# Patient Record
Sex: Female | Born: 1950
Health system: Southern US, Community
[De-identification: ages and names within clinical notes are randomized; demographics above are authoritative.]

## PROBLEM LIST (undated history)

## (undated) DIAGNOSIS — M1990 Unspecified osteoarthritis, unspecified site: Secondary | ICD-10-CM

## (undated) DIAGNOSIS — G4700 Insomnia, unspecified: Secondary | ICD-10-CM

## (undated) DIAGNOSIS — K219 Gastro-esophageal reflux disease without esophagitis: Secondary | ICD-10-CM

## (undated) DIAGNOSIS — R51 Headache: Secondary | ICD-10-CM

## (undated) HISTORY — DX: Unspecified osteoarthritis, unspecified site: M19.90

## (undated) HISTORY — PX: TONSILLECTOMY: SUR1361

## (undated) HISTORY — DX: Gastro-esophageal reflux disease without esophagitis: K21.9

## (undated) HISTORY — DX: Insomnia, unspecified: G47.00

## (undated) HISTORY — DX: Headache: R51

## (undated) HISTORY — PX: KNEE ARTHROSCOPY: SHX127

---

## 1998-04-16 ENCOUNTER — Emergency Department (HOSPITAL_COMMUNITY): Admission: EM | Admit: 1998-04-16 | Discharge: 1998-04-16 | Payer: Self-pay | Admitting: Emergency Medicine

## 1998-04-16 ENCOUNTER — Encounter: Payer: Self-pay | Admitting: Emergency Medicine

## 1998-11-19 ENCOUNTER — Other Ambulatory Visit: Admission: RE | Admit: 1998-11-19 | Discharge: 1998-11-19 | Payer: Self-pay | Admitting: Gynecology

## 1999-11-20 ENCOUNTER — Other Ambulatory Visit: Admission: RE | Admit: 1999-11-20 | Discharge: 1999-11-20 | Payer: Self-pay | Admitting: Gynecology

## 2000-10-26 ENCOUNTER — Other Ambulatory Visit: Admission: RE | Admit: 2000-10-26 | Discharge: 2000-10-26 | Payer: Self-pay | Admitting: Gynecology

## 2001-10-21 ENCOUNTER — Other Ambulatory Visit: Admission: RE | Admit: 2001-10-21 | Discharge: 2001-10-21 | Payer: Self-pay | Admitting: Gynecology

## 2002-11-01 ENCOUNTER — Other Ambulatory Visit: Admission: RE | Admit: 2002-11-01 | Discharge: 2002-11-01 | Payer: Self-pay | Admitting: Gynecology

## 2003-11-01 ENCOUNTER — Other Ambulatory Visit: Admission: RE | Admit: 2003-11-01 | Discharge: 2003-11-01 | Payer: Self-pay | Admitting: Gynecology

## 2004-10-23 ENCOUNTER — Encounter: Admission: RE | Admit: 2004-10-23 | Discharge: 2004-10-23 | Payer: Self-pay | Admitting: *Deleted

## 2004-11-25 ENCOUNTER — Other Ambulatory Visit: Admission: RE | Admit: 2004-11-25 | Discharge: 2004-11-25 | Payer: Self-pay | Admitting: Gynecology

## 2005-12-08 ENCOUNTER — Other Ambulatory Visit: Admission: RE | Admit: 2005-12-08 | Discharge: 2005-12-08 | Payer: Self-pay | Admitting: Gynecology

## 2005-12-24 ENCOUNTER — Ambulatory Visit: Payer: Self-pay | Admitting: Family Medicine

## 2006-02-04 ENCOUNTER — Ambulatory Visit: Payer: Self-pay | Admitting: Family Medicine

## 2006-02-25 ENCOUNTER — Ambulatory Visit: Payer: Self-pay | Admitting: Family Medicine

## 2006-10-14 ENCOUNTER — Emergency Department (HOSPITAL_COMMUNITY): Admission: EM | Admit: 2006-10-14 | Discharge: 2006-10-14 | Payer: Self-pay | Admitting: Emergency Medicine

## 2006-10-19 ENCOUNTER — Ambulatory Visit: Payer: Self-pay | Admitting: Family Medicine

## 2006-10-26 ENCOUNTER — Ambulatory Visit: Payer: Self-pay

## 2006-11-30 ENCOUNTER — Encounter: Payer: Self-pay | Admitting: Family Medicine

## 2006-12-09 ENCOUNTER — Other Ambulatory Visit: Admission: RE | Admit: 2006-12-09 | Discharge: 2006-12-09 | Payer: Self-pay | Admitting: Gynecology

## 2007-03-11 ENCOUNTER — Telehealth: Payer: Self-pay | Admitting: Family Medicine

## 2007-04-27 ENCOUNTER — Encounter: Payer: Self-pay | Admitting: Family Medicine

## 2007-09-21 ENCOUNTER — Telehealth: Payer: Self-pay | Admitting: Family Medicine

## 2007-09-23 ENCOUNTER — Telehealth: Payer: Self-pay | Admitting: Family Medicine

## 2007-10-18 ENCOUNTER — Ambulatory Visit: Payer: Self-pay | Admitting: Family Medicine

## 2007-10-18 DIAGNOSIS — R519 Headache, unspecified: Secondary | ICD-10-CM | POA: Insufficient documentation

## 2007-10-18 DIAGNOSIS — M1990 Unspecified osteoarthritis, unspecified site: Secondary | ICD-10-CM | POA: Insufficient documentation

## 2007-10-18 DIAGNOSIS — G4700 Insomnia, unspecified: Secondary | ICD-10-CM

## 2007-10-18 DIAGNOSIS — K219 Gastro-esophageal reflux disease without esophagitis: Secondary | ICD-10-CM

## 2007-10-18 DIAGNOSIS — R51 Headache: Secondary | ICD-10-CM

## 2007-10-18 DIAGNOSIS — J45909 Unspecified asthma, uncomplicated: Secondary | ICD-10-CM | POA: Insufficient documentation

## 2007-12-09 ENCOUNTER — Encounter: Payer: Self-pay | Admitting: Family Medicine

## 2007-12-09 ENCOUNTER — Other Ambulatory Visit: Admission: RE | Admit: 2007-12-09 | Discharge: 2007-12-09 | Payer: Self-pay | Admitting: Gynecology

## 2008-12-11 ENCOUNTER — Encounter: Payer: Self-pay | Admitting: Family Medicine

## 2008-12-25 ENCOUNTER — Telehealth: Payer: Self-pay | Admitting: Family Medicine

## 2008-12-28 ENCOUNTER — Ambulatory Visit: Payer: Self-pay | Admitting: Family Medicine

## 2008-12-31 IMAGING — CR DG CHEST 1V PORT
1 series · 1 of 1 positions shown · non-contrast
Comparison: Report of a prior chest x-ray of 04/16/98.

CLINICAL DATA: Chest pain/short of breath.
 PORTABLE CHEST ? 1 VIEW:

[view not recorded]
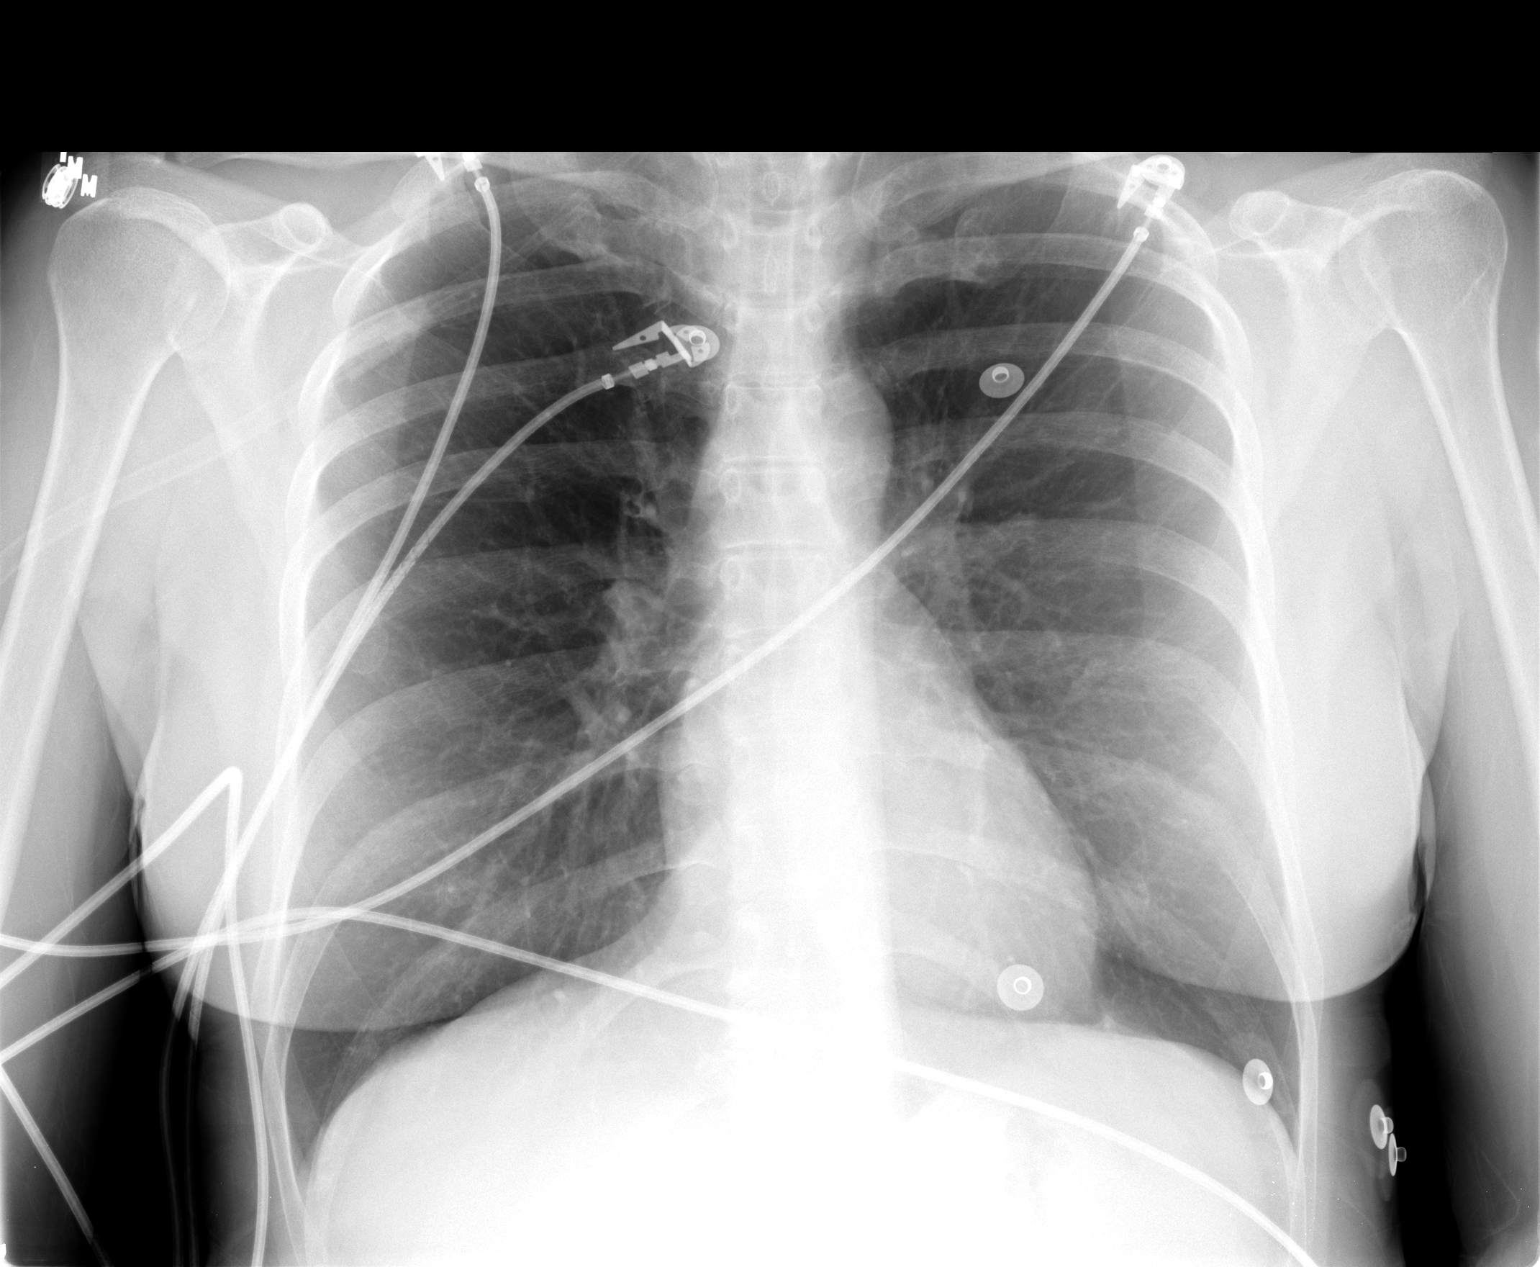

[1 of 1 positions shown; findings below may reference images not displayed]

FINDINGS: Heart and vascularity are normal.  The lungs are clear.  Osseous structures are intact in one view.
IMPRESSION: No active disease.

## 2009-01-15 ENCOUNTER — Ambulatory Visit: Payer: Self-pay | Admitting: Family Medicine

## 2009-01-15 DIAGNOSIS — R634 Abnormal weight loss: Secondary | ICD-10-CM | POA: Insufficient documentation

## 2009-01-15 LAB — CONVERTED CEMR LAB
Bilirubin Urine: NEGATIVE
Blood in Urine, dipstick: NEGATIVE
Glucose, Urine, Semiquant: NEGATIVE
Nitrite: NEGATIVE
Protein, U semiquant: NEGATIVE
Specific Gravity, Urine: 1.02
Urobilinogen, UA: 0.2
WBC Urine, dipstick: NEGATIVE
pH: 7.5

## 2009-01-18 LAB — CONVERTED CEMR LAB
ALT: 43 units/L — ABNORMAL HIGH (ref 0–35)
AST: 26 units/L (ref 0–37)
Albumin: 4 g/dL (ref 3.5–5.2)
Alkaline Phosphatase: 64 units/L (ref 39–117)
BUN: 14 mg/dL (ref 6–23)
Basophils Absolute: 0.1 10*3/uL (ref 0.0–0.1)
Basophils Relative: 0.6 % (ref 0.0–3.0)
Bilirubin, Direct: 0 mg/dL (ref 0.0–0.3)
CO2: 32 meq/L (ref 19–32)
Calcium: 9.3 mg/dL (ref 8.4–10.5)
Chloride: 105 meq/L (ref 96–112)
Creatinine, Ser: 1.1 mg/dL (ref 0.4–1.2)
Eosinophils Absolute: 0.1 10*3/uL (ref 0.0–0.7)
Eosinophils Relative: 1 % (ref 0.0–5.0)
GFR calc non Af Amer: 53.93 mL/min (ref >60)
Glucose, Bld: 89 mg/dL (ref 70–99)
HCT: 41.1 % (ref 36.0–46.0)
Hemoglobin: 14.1 g/dL (ref 12.0–15.0)
Lymphocytes Relative: 25.1 % (ref 12.0–46.0)
Lymphs Abs: 2.2 10*3/uL (ref 0.7–4.0)
MCHC: 34.2 g/dL (ref 30.0–36.0)
MCV: 93.2 fL (ref 78.0–100.0)
Monocytes Absolute: 0.3 10*3/uL (ref 0.1–1.0)
Monocytes Relative: 4 % (ref 3.0–12.0)
Neutro Abs: 6 10*3/uL (ref 1.4–7.7)
Neutrophils Relative %: 69.3 % (ref 43.0–77.0)
Platelets: 215 10*3/uL (ref 150.0–400.0)
Potassium: 4.6 meq/L (ref 3.5–5.1)
RBC: 4.42 M/uL (ref 3.87–5.11)
RDW: 12.7 % (ref 11.5–14.6)
Sodium: 142 meq/L (ref 135–145)
TSH: 1.69 microintl units/mL (ref 0.35–5.50)
Total Bilirubin: 0.8 mg/dL (ref 0.3–1.2)
Total Protein: 6.8 g/dL (ref 6.0–8.3)
WBC: 8.7 10*3/uL (ref 4.5–10.5)

## 2009-02-18 ENCOUNTER — Emergency Department (HOSPITAL_BASED_OUTPATIENT_CLINIC_OR_DEPARTMENT_OTHER): Admission: EM | Admit: 2009-02-18 | Discharge: 2009-02-18 | Payer: Self-pay | Admitting: Emergency Medicine

## 2010-06-05 ENCOUNTER — Encounter: Payer: Self-pay | Admitting: Family Medicine

## 2010-06-05 ENCOUNTER — Ambulatory Visit (INDEPENDENT_AMBULATORY_CARE_PROVIDER_SITE_OTHER): Payer: BC Managed Care – PPO | Admitting: Family Medicine

## 2010-06-05 VITALS — BP 110/72 | HR 97 | Wt 132.0 lb

## 2010-06-05 DIAGNOSIS — G4700 Insomnia, unspecified: Secondary | ICD-10-CM

## 2010-06-05 MED ORDER — TRAZODONE HCL 50 MG PO TABS: 50.0000 mg | ORAL_TABLET | Freq: Every day | ORAL | Status: DC

## 2010-06-05 NOTE — Progress Notes (Signed)
  Subjective:    Patient ID: Brooke Burke, female    DOB: 27-Dec-1950, 60 y.o.   MRN: [id number]  HPI Here for refills on Trazodone. She is doing well and has no complaints. Her last visit here was a year and a half ago. She has stopped taking Prilosec since her GERD went away.    Review of Systems  Constitutional: Negative.   Respiratory: Negative.   Cardiovascular: Negative.   Gastrointestinal: Negative.        Objective:   Physical Exam  Constitutional: She appears well-developed and well-nourished. No distress.  Neck: No thyromegaly present.  Cardiovascular: Normal rate, regular rhythm, normal heart sounds and intact distal pulses.  Exam reveals no gallop and no friction rub.   No murmur heard. Pulmonary/Chest: Effort normal and breath sounds normal. No respiratory distress. She has no wheezes. She has no rales. She exhibits no tenderness.  Lymphadenopathy:    She has no cervical adenopathy.          Assessment & Plan:  Doing well. Set up labs soon.

## 2010-06-05 NOTE — Patient Instructions (Signed)
Set up for fasting cpx labs in the next 2 weeks for V70.0

## 2010-06-19 ENCOUNTER — Other Ambulatory Visit: Payer: BC Managed Care – PPO

## 2010-07-13 ENCOUNTER — Other Ambulatory Visit: Payer: Self-pay | Admitting: Family Medicine

## 2010-08-27 ENCOUNTER — Encounter: Payer: Self-pay | Admitting: Family Medicine

## 2010-09-03 ENCOUNTER — Ambulatory Visit (INDEPENDENT_AMBULATORY_CARE_PROVIDER_SITE_OTHER): Payer: BC Managed Care – PPO | Admitting: Family Medicine

## 2010-09-03 ENCOUNTER — Encounter: Payer: Self-pay | Admitting: Family Medicine

## 2010-09-03 VITALS — BP 124/68 | HR 60 | Temp 98.3°F | Ht 63.0 in | Wt 135.0 lb

## 2010-09-03 DIAGNOSIS — Z23 Encounter for immunization: Secondary | ICD-10-CM

## 2010-09-03 DIAGNOSIS — Z20820 Contact with and (suspected) exposure to varicella: Secondary | ICD-10-CM

## 2010-09-03 NOTE — Progress Notes (Signed)
  Subjective:    Patient ID: Brooke Burke, female    DOB: 27-Aug-1950, 60 y.o.   MRN: [id number]  HPI Her asking advice about a recent exposure to chickpox. To her knowledge she never had chickpox as a child. Then 2 days ago her boyfriend was diagnosed with this. She was told that she could get it and wants to be checked. She has no symptoms now, no fever or rash.    Review of Systems  Constitutional: Negative.   Respiratory: Negative.   Cardiovascular: Negative.   Skin: Negative.        Objective:   Physical Exam  Constitutional: She appears well-developed and well-nourished.  Cardiovascular: Normal rate, regular rhythm, normal heart sounds and intact distal pulses.   Pulmonary/Chest: Effort normal and breath sounds normal. No respiratory distress. She has no wheezes. She has no rales. She exhibits no tenderness.  Lymphadenopathy:    She has no cervical adenopathy.          Assessment & Plan:  We will check fop immunity with titers today. If she is not immune we will give her the shots.

## 2010-09-04 ENCOUNTER — Telehealth: Payer: Self-pay | Admitting: *Deleted

## 2010-09-04 NOTE — Telephone Encounter (Signed)
She has immunity to chickenpox, so she cannot get this and she cannot give it to anyone else. She may safely visit her pregnant family member

## 2010-09-04 NOTE — Telephone Encounter (Signed)
Pt needs lab results ASAP.

## 2010-09-05 ENCOUNTER — Telehealth: Payer: Self-pay | Admitting: *Deleted

## 2010-09-05 NOTE — Telephone Encounter (Signed)
LMOM to inform Pt. 

## 2010-09-05 NOTE — Telephone Encounter (Signed)
Pt called back to get status of lab results. Pt has been given info as noted below.

## 2010-09-05 NOTE — Telephone Encounter (Signed)
Message copied by Burnard Leigh on Thu Sep 05, 2010  2:00 PM ------      Message from: Dwaine Deter      Created: Thu Sep 05, 2010  8:05 AM       She shows immunity to chickenpox

## 2010-09-13 NOTE — Assessment & Plan Note (Signed)
[facility]                              [facility] OFFICE NOTE   Brooke Burke, Brooke Burke                        MRN:          [id number]  DATE:12/24/2005                            DOB:          [birth year]    This is a 60 year old woman here to reestablish with our practice.  She also  has a couple of other concerns.  Over the last several years, she has had  problems with insomnia.  She has used some over-the-counter agents but has  never used a prescription medication for it.  She says sometimes she has  trouble falling asleep.  Sometimes she falls asleep and then wakes up early  and cannot go back.  She says she seems restless and cannot relax; however,  she denies any particular anxiety or depression.  She is also requesting a  tetanus booster.   PAST MEDICAL HISTORY:  She had been going to urgent care off and on over the  last several years due to a lack of insurance.  Fortunately, she now has  good insurance again through her job.  She has arthritis in her left knee.  She has a history of asthma, which has been very well controlled over the  last several years.  She has a history of migraine headaches, which also  have not bothered her in over a year.  She has had a tonsillectomy.  She has  had one vaginal delivery.  She sees Dr. Chevis Pretty for gynecology care.  She has  never had a colonoscopy.   ALLERGIES:  None   MEDICATIONS:  None.   HABITS:  She drinks some alcohol but does not use tobacco.   SOCIAL HISTORY:  She is divorced.  She is a Runner, broadcasting/film/video.   FAMILY HISTORY:  Remarkable for high cholesterol, high blood pressure, and  diabetes.   OBJECTIVE:  VITAL SIGNS:  Height 5 feet 3 inches.  Weight 132.  BP 120/60,  pulse 72 and regular.  GENERAL:  She appears to be healthy.  Her affect is bright.  We did not do a  detailed examination today.   ASSESSMENT/PLAN:  1. Health maintenance:  I asked her to schedule a physical with fasting      labs  with [place] soon to get back on a regular schedule.  She was also      given a TD booster today.  2. Insomnia:  We will try temazepam 30 mg nightly as needed, #30 with two      refills.                                   Tera Mater. Clent Ridges, MD   SAF/MedQ  DD:  12/25/2005  DT:  12/25/2005  Job #:  [id number]

## 2011-02-12 LAB — COMPREHENSIVE METABOLIC PANEL
ALT: 37 — ABNORMAL HIGH
AST: 43 — ABNORMAL HIGH
Albumin: 3.6
Calcium: 9.9
Chloride: 106
Creatinine, Ser: 0.83
GFR calc Af Amer: >60
Sodium: 141

## 2011-02-12 LAB — PROTIME-INR: INR: 0.9

## 2011-02-12 LAB — DIFFERENTIAL
Eosinophils Relative: 2
Lymphocytes Relative: 32
Lymphs Abs: 2.2
Monocytes Absolute: 0.3

## 2011-02-12 LAB — I-STAT 8, (EC8 V) (CONVERTED LAB)
BUN: 10
Chloride: 108
Glucose, Bld: 94
Hemoglobin: 14.3
Potassium: 3.8
Sodium: 139
TCO2: 25
pH, Ven: 7.505 — ABNORMAL HIGH

## 2011-02-12 LAB — APTT: aPTT: 29

## 2011-02-12 LAB — CBC
MCV: 89.1
Platelets: 203
WBC: 6.9

## 2011-02-12 LAB — POCT CARDIAC MARKERS
CKMB, poc: <1 — ABNORMAL LOW
Myoglobin, poc: 52.7
Operator id: 234501
Troponin i, poc: <0.05

## 2011-02-12 LAB — POCT I-STAT CREATININE: Operator id: 234501

## 2011-07-03 ENCOUNTER — Other Ambulatory Visit: Payer: Self-pay | Admitting: Family Medicine

## 2011-12-08 ENCOUNTER — Encounter: Payer: Self-pay | Admitting: Family Medicine

## 2012-02-24 ENCOUNTER — Telehealth: Payer: Self-pay | Admitting: Family Medicine

## 2012-02-24 NOTE — Telephone Encounter (Signed)
Caller: [name]/Patient; Patient Name: Brooke Burke; PCP: Gershon Crane [facility]); Best Callback Phone Number: [phone].  Patient would like to schedule shingles vaccine shot, and does not know how to make appt for this.  Caller transferred to office for assistance.

## 2012-02-25 ENCOUNTER — Ambulatory Visit (INDEPENDENT_AMBULATORY_CARE_PROVIDER_SITE_OTHER): Payer: BC Managed Care – PPO | Admitting: Family Medicine

## 2012-02-25 DIAGNOSIS — Z0000 Encounter for general adult medical examination without abnormal findings: Secondary | ICD-10-CM

## 2012-02-25 DIAGNOSIS — Z2911 Encounter for prophylactic immunotherapy for respiratory syncytial virus (RSV): Secondary | ICD-10-CM

## 2012-07-04 ENCOUNTER — Other Ambulatory Visit: Payer: Self-pay | Admitting: Family Medicine

## 2012-10-04 ENCOUNTER — Encounter: Payer: Self-pay | Admitting: Family Medicine

## 2012-10-04 ENCOUNTER — Ambulatory Visit (INDEPENDENT_AMBULATORY_CARE_PROVIDER_SITE_OTHER): Payer: BC Managed Care – PPO | Admitting: Family Medicine

## 2012-10-04 DIAGNOSIS — T6391XA Toxic effect of contact with unspecified venomous animal, accidental (unintentional), initial encounter: Secondary | ICD-10-CM

## 2012-10-04 DIAGNOSIS — T63391A Toxic effect of venom of other spider, accidental (unintentional), initial encounter: Secondary | ICD-10-CM

## 2012-10-04 MED ORDER — DOXYCYCLINE HYCLATE 100 MG PO TABS: 100.0000 mg | ORAL_TABLET | Freq: Two times a day (BID) | ORAL | Status: DC

## 2012-10-04 NOTE — Progress Notes (Signed)
  Subjective:    Patient ID: Brooke Burke, female    DOB: 05-22-1950, 62 y.o.   MRN: [id number]  HPI Here for 3 days of a tender red area on the lower right leg. She had been working in her yard and garage last weekend. She feels fine in general.   Review of Systems  Constitutional: Negative.   Skin: Positive for rash.       Objective:   Physical Exam  Constitutional: She appears well-developed and well-nourished. No distress.  Skin:  The right lower leg has a macular area of erythema, warmth and tenderness          Assessment & Plan:  Cover with Doxycycline

## 2013-06-27 ENCOUNTER — Other Ambulatory Visit: Payer: Self-pay | Admitting: Family Medicine

## 2014-07-12 ENCOUNTER — Telehealth: Payer: Self-pay | Admitting: Family Medicine

## 2014-07-12 NOTE — Telephone Encounter (Signed)
Patient need re-fill on traZODone (DESYREL) 50 MG tablet sent to [id number] - [place], [place] - [address]

## 2014-07-13 MED ORDER — TRAZODONE HCL 50 MG PO TABS: 50.0000 mg | ORAL_TABLET | Freq: Every day | ORAL | Status: DC

## 2014-07-13 NOTE — Telephone Encounter (Signed)
Refill for one year 

## 2014-07-13 NOTE — Telephone Encounter (Signed)
Done

## 2014-09-05 HISTORY — PX: COLONOSCOPY: SHX174

## 2014-09-12 ENCOUNTER — Encounter: Payer: Self-pay | Admitting: Family Medicine

## 2014-09-14 ENCOUNTER — Encounter: Payer: Self-pay | Admitting: Family Medicine

## 2015-06-27 ENCOUNTER — Other Ambulatory Visit: Payer: Self-pay | Admitting: Family Medicine

## 2015-09-28 ENCOUNTER — Ambulatory Visit (INDEPENDENT_AMBULATORY_CARE_PROVIDER_SITE_OTHER): Payer: Medicare Other | Admitting: Family Medicine

## 2015-09-28 ENCOUNTER — Encounter: Payer: Self-pay | Admitting: Family Medicine

## 2015-09-28 VITALS — BP 126/78 | HR 74 | Temp 98.1°F | Ht 63.0 in | Wt 143.0 lb

## 2015-09-28 DIAGNOSIS — N644 Mastodynia: Secondary | ICD-10-CM

## 2015-09-28 NOTE — Progress Notes (Signed)
Pre visit review using our clinic review tool, if applicable. No additional management support is needed unless otherwise documented below in the visit note. 

## 2015-09-28 NOTE — Progress Notes (Signed)
   Subjective:    Patient ID: Brooke Burke, female    DOB: Dec 01, 1950, 65 y.o.   MRN: [id number]  HPI Here for 3 weeks of mild pain in the left breast. No lumps are felt, no nipple DC or color changes. Her last mammogram was clear in August 2013.    Review of Systems  Constitutional: Negative.        Objective:   Physical Exam  Constitutional: She appears well-developed and well-nourished.  Genitourinary:  Right breast and axilla are normal. The left breast has a normal appearance. No nipple DC. She is moderately tender at the 5 o'clock and the 9 o'clock positions. thetre are indistinct densities in these areas but no defines lumps. The left axilla is clear.           Assessment & Plan:  Left breast pain. Set up for diagnostic mammogram and left breast US ASAP.  Nelwyn SalisburyFRY,STEPHEN A, MD

## 2015-10-12 ENCOUNTER — Ambulatory Visit
Admission: RE | Admit: 2015-10-12 | Discharge: 2015-10-12 | Disposition: A | Discharge status: Home / Self Care | Payer: Medicare Other | Source: Ambulatory Visit | Attending: Family Medicine | Admitting: Family Medicine

## 2015-10-12 ENCOUNTER — Other Ambulatory Visit: Payer: Medicare Other

## 2015-10-12 DIAGNOSIS — N644 Mastodynia: Secondary | ICD-10-CM

## 2016-05-26 ENCOUNTER — Encounter: Payer: Self-pay | Admitting: Family Medicine

## 2016-05-26 ENCOUNTER — Ambulatory Visit (INDEPENDENT_AMBULATORY_CARE_PROVIDER_SITE_OTHER): Payer: Medicare Other | Admitting: Family Medicine

## 2016-05-26 VITALS — BP 150/88 | HR 70 | Temp 98.1°F | Ht 63.0 in | Wt 153.0 lb

## 2016-05-26 DIAGNOSIS — J019 Acute sinusitis, unspecified: Secondary | ICD-10-CM

## 2016-05-26 MED ORDER — AZITHROMYCIN 250 MG PO TABS: ORAL_TABLET | ORAL | 0 refills | Status: DC

## 2016-05-26 NOTE — Progress Notes (Signed)
   Subjective:    Patient ID: Brooke Burke, female    DOB: [birth year], 66 y.o.   MRN: [id number]  HPI Here for 3 days of sinus pressure, PND, hoarse voice, and a dry cough. No fever. On Mucinex.    Review of Systems  Constitutional: Negative.   HENT: Positive for congestion, postnasal drip, sinus pain, sinus pressure and voice change. Negative for sore throat.   Eyes: Negative.   Respiratory: Positive for cough.        Objective:   Physical Exam  Constitutional: She appears well-developed and well-nourished.  HENT:  Right Ear: External ear normal.  Left Ear: External ear normal.  Nose: Nose normal.  Mouth/Throat: Oropharynx is clear and moist. No oropharyngeal exudate.  Eyes: Conjunctivae are normal.  Neck: No thyromegaly present.  Pulmonary/Chest: Effort normal and breath sounds normal.  Lymphadenopathy:    She has no cervical adenopathy.          Assessment & Plan:  Early sinusitis, treat with a Zpack. Drink fluids. She will stay out of work today and tomorrow.  Gershon CraneStephen Gregroy Dombkowski, MD

## 2016-05-26 NOTE — Progress Notes (Signed)
Pre visit review using our clinic review tool, if applicable. No additional management support is needed unless otherwise documented below in the visit note. 

## 2016-06-25 ENCOUNTER — Other Ambulatory Visit: Payer: Self-pay | Admitting: Family Medicine

## 2016-11-26 ENCOUNTER — Telehealth: Payer: Self-pay | Admitting: Family Medicine

## 2016-11-26 NOTE — Telephone Encounter (Signed)
Call in Transdermal Scopolamine patches to wear q 3 days as needed, box of 10

## 2016-11-26 NOTE — Telephone Encounter (Signed)
Pt states she is going on a cruise Friday and would like a Rx for sea sickness.  [id number] [place], [place] - [address]

## 2016-11-27 MED ORDER — SCOPOLAMINE 1 MG/3DAYS TD PT72: 1.0000 | MEDICATED_PATCH | TRANSDERMAL | 0 refills | Status: DC

## 2016-11-27 NOTE — Telephone Encounter (Signed)
[facility] and left a voice message with this information for pt.  

## 2016-11-28 ENCOUNTER — Telehealth: Payer: Self-pay

## 2016-11-28 NOTE — Telephone Encounter (Signed)
Received PA request for Scopolamine. PA submitted & pending. Key: [id number]

## 2016-11-28 NOTE — Telephone Encounter (Signed)
PA approved, form faxed back to pharmacy. 

## 2017-07-03 ENCOUNTER — Other Ambulatory Visit: Payer: Self-pay | Admitting: Family Medicine

## 2017-07-03 NOTE — Telephone Encounter (Signed)
Last OV 05/26/2016   Last refilled 06/25/2016 disp 90 with 3 refills   Sent to PCP for approval

## 2017-10-26 ENCOUNTER — Ambulatory Visit: Payer: Medicare Other | Admitting: Family Medicine

## 2017-10-26 ENCOUNTER — Encounter: Payer: Self-pay | Admitting: Family Medicine

## 2017-10-26 VITALS — BP 126/80 | HR 71 | Temp 98.0°F | Ht 63.0 in | Wt 147.2 lb

## 2017-10-26 DIAGNOSIS — J069 Acute upper respiratory infection, unspecified: Secondary | ICD-10-CM

## 2017-10-26 DIAGNOSIS — H00011 Hordeolum externum right upper eyelid: Secondary | ICD-10-CM

## 2017-10-26 MED ORDER — TOBRAMYCIN 0.3 % OP SOLN: 2.0000 [drp] | OPHTHALMIC | 0 refills | Status: DC

## 2017-10-26 NOTE — Progress Notes (Signed)
   Subjective:    Patient ID: Brooke Burke, female    DOB: [birth year], 67 y.o.   MRN: [id number]  HPI Here for worsening eye irritation and sinus symptoms. She had swelling and discomfort in the right eye last week and she went to an urgent care. She was diagnosed with a stye in the right upper lid and was given erythromycin ointment. This has not helped, and in fact now both eyes are irritated. She has sinus congestion and PND. She has taken 2 doses of Levaquin.    Review of Systems  Constitutional: Negative.   HENT: Positive for congestion and postnasal drip. Negative for ear pain, sinus pressure, sinus pain and sore throat.   Eyes: Positive for redness and itching.  Respiratory: Positive for cough.        Objective:   Physical Exam  Constitutional: She appears well-developed and well-nourished. No distress.  HENT:  Right Ear: External ear normal.  Left Ear: External ear normal.  Nose: Nose normal.  Mouth/Throat: Oropharynx is clear and moist.  Eyes: Pupils are equal, round, and reactive to light. Conjunctivae and EOM are normal.  Right upper lid has a tender stye   Neck: Neck supple. No thyromegaly present.  Pulmonary/Chest: Effort normal and breath sounds normal. No stridor. No respiratory distress. She has no wheezes. She has no rales.          Assessment & Plan:  She has a stye and a mild viral URI. She will stop the erythromycin ointment and try Tobramycin drops. Use warm compresses to the area. Stop the Levaquin. Add Mucinex prn.  Gershon CraneStephen Fry, MD

## 2018-02-09 ENCOUNTER — Telehealth: Payer: Self-pay | Admitting: Family Medicine

## 2018-02-09 NOTE — Telephone Encounter (Signed)
Noted and documented in chart.

## 2018-02-09 NOTE — Telephone Encounter (Signed)
Copied from CRM [id number]. Topic: Quick Communication - See Telephone Encounter >> Feb 09, 2018 12:56 PM Terisa Starr wrote: CRM for notification. See Telephone encounter for: 02/09/18.  Patient said that Brooke Burke [facility] the [facility].. Brooke Burke just wants Dr Clent Ridges to be aware of this. Thanks

## 2018-03-05 ENCOUNTER — Encounter: Payer: Self-pay | Admitting: Family Medicine

## 2018-03-05 ENCOUNTER — Ambulatory Visit: Payer: Medicare Other | Admitting: Family Medicine

## 2018-03-05 VITALS — BP 122/70 | HR 66 | Temp 97.9°F | Wt 144.3 lb

## 2018-03-05 DIAGNOSIS — R600 Localized edema: Secondary | ICD-10-CM | POA: Diagnosis not present

## 2018-03-05 DIAGNOSIS — L03119 Cellulitis of unspecified part of limb: Secondary | ICD-10-CM | POA: Diagnosis not present

## 2018-03-05 LAB — CBC WITH DIFFERENTIAL/PLATELET
BASOS PCT: 0.3 % (ref 0.0–3.0)
Basophils Absolute: 0 10*3/uL (ref 0.0–0.1)
Eosinophils Absolute: 0.1 10*3/uL (ref 0.0–0.7)
Eosinophils Relative: 1.6 % (ref 0.0–5.0)
HEMATOCRIT: 43.5 % (ref 36.0–46.0)
Hemoglobin: 14.5 g/dL (ref 12.0–15.0)
LYMPHS PCT: 32.4 % (ref 12.0–46.0)
Lymphs Abs: 2.4 10*3/uL (ref 0.7–4.0)
MCHC: 33.3 g/dL (ref 30.0–36.0)
MCV: 91.1 fl (ref 78.0–100.0)
MONOS PCT: 5 % (ref 3.0–12.0)
Monocytes Absolute: 0.4 10*3/uL (ref 0.1–1.0)
NEUTROS ABS: 4.5 10*3/uL (ref 1.4–7.7)
Neutrophils Relative %: 60.7 % (ref 43.0–77.0)
Platelets: 236 10*3/uL (ref 150.0–400.0)
RBC: 4.77 Mil/uL (ref 3.87–5.11)
RDW: 13.3 % (ref 11.5–15.5)
WBC: 7.4 10*3/uL (ref 4.0–10.5)

## 2018-03-05 LAB — BASIC METABOLIC PANEL
BUN: 16 mg/dL (ref 6–23)
CALCIUM: 9.5 mg/dL (ref 8.4–10.5)
CHLORIDE: 104 meq/L (ref 96–112)
CO2: 30 meq/L (ref 19–32)
CREATININE: 0.89 mg/dL (ref 0.40–1.20)
GFR: 66.9 mL/min (ref >60.00)
GLUCOSE: 89 mg/dL (ref 70–99)
Potassium: 4.6 mEq/L (ref 3.5–5.1)
Sodium: 139 mEq/L (ref 135–145)

## 2018-03-05 LAB — TSH: TSH: 2.21 u[IU]/mL (ref 0.35–4.50)

## 2018-03-05 MED ORDER — FUROSEMIDE 20 MG PO TABS: 20.0000 mg | ORAL_TABLET | Freq: Every day | ORAL | 3 refills | Status: DC | PRN

## 2018-03-05 NOTE — Progress Notes (Signed)
   Subjective:    Patient ID: Brooke Burke, female    DOB: [birth year], 67 y.o.   MRN: [id number]  HPI Here for swelling in the legs last week. She gets this once in awhile if she is on her feet a lot. Last week she spent 4 days in [facility] and was on her feet walking all day. Both lower legs swelled up and became painful. Then one day the skin above both ankles turned red and felt warm. No fever. She had no chest pain or SOB. She took pictures of her legs with her cell phone and showed them to me today. Then the redness went away and the swelling resolved. Today she feels fine.    Review of Systems  Constitutional: Negative.   Respiratory: Negative.   Cardiovascular: Positive for leg swelling. Negative for chest pain and palpitations.  Skin: Positive for color change.       Objective:   Physical Exam  Constitutional: She is oriented to person, place, and time. She appears well-developed and well-nourished.  Cardiovascular: Normal rate, regular rhythm, normal heart sounds and intact distal pulses.  Pulmonary/Chest: Effort normal and breath sounds normal. No stridor. No respiratory distress. She has no wheezes. She has no rales.  Musculoskeletal: She exhibits no edema.  Neurological: She is oriented to person, place, and time.          Assessment & Plan:  Transient leg edema with cellulitis, which has spontaneously resolved. We will get labs to check renal function, etc. She can use Lasix 20 mg daily as needed for swelling. Recheck prn. Gershon Crane, MD

## 2018-03-18 ENCOUNTER — Telehealth: Payer: Self-pay | Admitting: *Deleted

## 2018-03-18 NOTE — Telephone Encounter (Signed)
Patient notified of her lab results.  Notes recorded by Nelwyn SalisburyFry, Stephen A, MD on 03/10/2018 at 9:13 AM EST Normal

## 2018-06-08 ENCOUNTER — Other Ambulatory Visit: Payer: Self-pay | Admitting: Family Medicine

## 2018-06-09 NOTE — Telephone Encounter (Signed)
Last rx given on 07/03/2017 for #90 with 3 ref

## 2019-07-01 ENCOUNTER — Other Ambulatory Visit: Payer: Self-pay | Admitting: Family Medicine

## 2019-07-18 ENCOUNTER — Telehealth: Payer: Self-pay | Admitting: Family Medicine

## 2019-07-18 NOTE — Telephone Encounter (Signed)
Message Routed to PCP  for approval. 

## 2019-07-18 NOTE — Telephone Encounter (Signed)
Pt states that she is not staying asleep anymore and is wondering if a bigger dosage trazodone will help or not?  Pt can be reached at [phone]   Medication Refill: trazodone [facility] [address] FAX: [phone]

## 2019-07-19 MED ORDER — TRAZODONE HCL 100 MG PO TABS: 100.0000 mg | ORAL_TABLET | Freq: Every day | ORAL | 5 refills | Status: DC

## 2019-07-19 NOTE — Telephone Encounter (Signed)
Yes, we will increase the Trazodone to 100 mg at bedtime. Call in #30 with 5 rf

## 2019-07-19 NOTE — Telephone Encounter (Signed)
Left detailed message informing  of update. 

## 2019-07-19 NOTE — Addendum Note (Signed)
Addended by: Waymon Amato R on: 07/19/2019 09:34 AM   Modules accepted: Orders

## 2019-08-10 ENCOUNTER — Other Ambulatory Visit: Payer: Self-pay | Admitting: Family Medicine

## 2019-09-12 DIAGNOSIS — M25562 Pain in left knee: Secondary | ICD-10-CM | POA: Diagnosis not present

## 2019-09-12 DIAGNOSIS — M170 Bilateral primary osteoarthritis of knee: Secondary | ICD-10-CM | POA: Diagnosis not present

## 2019-09-12 DIAGNOSIS — M25561 Pain in right knee: Secondary | ICD-10-CM | POA: Diagnosis not present

## 2019-11-23 DIAGNOSIS — M25562 Pain in left knee: Secondary | ICD-10-CM | POA: Diagnosis not present

## 2019-11-23 DIAGNOSIS — M25561 Pain in right knee: Secondary | ICD-10-CM | POA: Diagnosis not present

## 2019-12-07 DIAGNOSIS — M25562 Pain in left knee: Secondary | ICD-10-CM | POA: Diagnosis not present

## 2019-12-21 DIAGNOSIS — M1711 Unilateral primary osteoarthritis, right knee: Secondary | ICD-10-CM | POA: Diagnosis not present

## 2019-12-21 DIAGNOSIS — M25562 Pain in left knee: Secondary | ICD-10-CM | POA: Diagnosis not present

## 2019-12-21 DIAGNOSIS — M1712 Unilateral primary osteoarthritis, left knee: Secondary | ICD-10-CM | POA: Diagnosis not present

## 2019-12-21 DIAGNOSIS — S83232D Complex tear of medial meniscus, current injury, left knee, subsequent encounter: Secondary | ICD-10-CM | POA: Diagnosis not present

## 2019-12-21 DIAGNOSIS — M170 Bilateral primary osteoarthritis of knee: Secondary | ICD-10-CM | POA: Diagnosis not present

## 2020-01-04 DIAGNOSIS — D2272 Melanocytic nevi of left lower limb, including hip: Secondary | ICD-10-CM | POA: Diagnosis not present

## 2020-01-04 DIAGNOSIS — D225 Melanocytic nevi of trunk: Secondary | ICD-10-CM | POA: Diagnosis not present

## 2020-01-04 DIAGNOSIS — D692 Other nonthrombocytopenic purpura: Secondary | ICD-10-CM | POA: Diagnosis not present

## 2020-01-04 DIAGNOSIS — L820 Inflamed seborrheic keratosis: Secondary | ICD-10-CM | POA: Diagnosis not present

## 2020-01-04 DIAGNOSIS — D1801 Hemangioma of skin and subcutaneous tissue: Secondary | ICD-10-CM | POA: Diagnosis not present

## 2020-01-04 DIAGNOSIS — L821 Other seborrheic keratosis: Secondary | ICD-10-CM | POA: Diagnosis not present

## 2020-01-04 DIAGNOSIS — Z85828 Personal history of other malignant neoplasm of skin: Secondary | ICD-10-CM | POA: Diagnosis not present

## 2020-01-06 DIAGNOSIS — M1712 Unilateral primary osteoarthritis, left knee: Secondary | ICD-10-CM | POA: Diagnosis not present

## 2020-01-06 DIAGNOSIS — S83232D Complex tear of medial meniscus, current injury, left knee, subsequent encounter: Secondary | ICD-10-CM | POA: Diagnosis not present

## 2020-01-17 ENCOUNTER — Other Ambulatory Visit: Payer: Self-pay | Admitting: Family Medicine

## 2020-02-07 DIAGNOSIS — M94262 Chondromalacia, left knee: Secondary | ICD-10-CM | POA: Diagnosis not present

## 2020-02-07 DIAGNOSIS — G8918 Other acute postprocedural pain: Secondary | ICD-10-CM | POA: Diagnosis not present

## 2020-02-07 DIAGNOSIS — S83232A Complex tear of medial meniscus, current injury, left knee, initial encounter: Secondary | ICD-10-CM | POA: Diagnosis not present

## 2020-02-07 DIAGNOSIS — S83272A Complex tear of lateral meniscus, current injury, left knee, initial encounter: Secondary | ICD-10-CM | POA: Diagnosis not present

## 2020-02-14 ENCOUNTER — Other Ambulatory Visit: Payer: Self-pay | Admitting: Family Medicine

## 2020-02-14 DIAGNOSIS — M25562 Pain in left knee: Secondary | ICD-10-CM | POA: Diagnosis not present

## 2020-02-20 DIAGNOSIS — Z4789 Encounter for other orthopedic aftercare: Secondary | ICD-10-CM | POA: Diagnosis not present

## 2020-02-20 DIAGNOSIS — M25562 Pain in left knee: Secondary | ICD-10-CM | POA: Diagnosis not present

## 2020-02-23 DIAGNOSIS — M25562 Pain in left knee: Secondary | ICD-10-CM | POA: Diagnosis not present

## 2020-02-25 ENCOUNTER — Other Ambulatory Visit: Payer: Self-pay

## 2020-02-25 ENCOUNTER — Ambulatory Visit: Payer: Medicare PPO | Attending: Internal Medicine

## 2020-02-25 DIAGNOSIS — Z23 Encounter for immunization: Secondary | ICD-10-CM

## 2020-02-25 NOTE — Progress Notes (Signed)
   Covid-19 Vaccination Clinic  Name:  HARLO JASO    MRN: [id number] DOB: 1951-02-25  02/25/2020  Ms. Guin was observed post Covid-19 immunization for 15 minutes without incident. She was provided with Vaccine Information Sheet and instruction to access the V-Safe system.   Ms. Luty was instructed to call 911 with any severe reactions post vaccine: Marland Kitchen Difficulty breathing  . Swelling of face and throat  . A fast heartbeat  . A bad rash all over body  . Dizziness and weakness

## 2020-02-28 DIAGNOSIS — M25562 Pain in left knee: Secondary | ICD-10-CM | POA: Diagnosis not present

## 2020-03-01 DIAGNOSIS — M25562 Pain in left knee: Secondary | ICD-10-CM | POA: Diagnosis not present

## 2020-03-08 DIAGNOSIS — M25562 Pain in left knee: Secondary | ICD-10-CM | POA: Diagnosis not present

## 2020-03-16 DIAGNOSIS — M25562 Pain in left knee: Secondary | ICD-10-CM | POA: Diagnosis not present

## 2020-03-19 DIAGNOSIS — M25562 Pain in left knee: Secondary | ICD-10-CM | POA: Diagnosis not present

## 2020-05-21 ENCOUNTER — Telehealth: Payer: Self-pay | Admitting: Family Medicine

## 2020-05-21 NOTE — Telephone Encounter (Signed)
Left message for patient to call back and schedule Medicare Annual Wellness Visit (AWV) either virtually or in office.   Last AWV no information  please schedule at anytime with [facility] Nurse Health Advisor 1 or 2   This should be a 45 minute visit.  Patient also needs appointment with PCP last appointment 03/05/18

## 2020-05-28 DIAGNOSIS — M23203 Derangement of unspecified medial meniscus due to old tear or injury, right knee: Secondary | ICD-10-CM | POA: Diagnosis not present

## 2020-05-29 ENCOUNTER — Other Ambulatory Visit (HOSPITAL_COMMUNITY): Payer: Self-pay | Admitting: Physician Assistant

## 2020-05-29 ENCOUNTER — Other Ambulatory Visit: Payer: Self-pay | Admitting: Physician Assistant

## 2020-05-29 DIAGNOSIS — M25561 Pain in right knee: Secondary | ICD-10-CM

## 2020-06-03 ENCOUNTER — Ambulatory Visit (HOSPITAL_COMMUNITY): Payer: Medicare PPO

## 2020-06-03 ENCOUNTER — Encounter (HOSPITAL_COMMUNITY): Payer: Self-pay

## 2020-06-16 DIAGNOSIS — M25561 Pain in right knee: Secondary | ICD-10-CM | POA: Diagnosis not present

## 2020-06-18 DIAGNOSIS — S83241A Other tear of medial meniscus, current injury, right knee, initial encounter: Secondary | ICD-10-CM | POA: Diagnosis not present

## 2020-06-18 DIAGNOSIS — M1711 Unilateral primary osteoarthritis, right knee: Secondary | ICD-10-CM | POA: Diagnosis not present

## 2020-07-03 DIAGNOSIS — M2241 Chondromalacia patellae, right knee: Secondary | ICD-10-CM | POA: Diagnosis not present

## 2020-07-03 DIAGNOSIS — M2341 Loose body in knee, right knee: Secondary | ICD-10-CM | POA: Diagnosis not present

## 2020-07-03 DIAGNOSIS — G8918 Other acute postprocedural pain: Secondary | ICD-10-CM | POA: Diagnosis not present

## 2020-07-03 DIAGNOSIS — S83231A Complex tear of medial meniscus, current injury, right knee, initial encounter: Secondary | ICD-10-CM | POA: Diagnosis not present

## 2020-07-03 DIAGNOSIS — M94261 Chondromalacia, right knee: Secondary | ICD-10-CM | POA: Diagnosis not present

## 2020-07-10 DIAGNOSIS — M25561 Pain in right knee: Secondary | ICD-10-CM | POA: Diagnosis not present

## 2020-07-16 DIAGNOSIS — M25561 Pain in right knee: Secondary | ICD-10-CM | POA: Diagnosis not present

## 2020-07-18 DIAGNOSIS — M25561 Pain in right knee: Secondary | ICD-10-CM | POA: Diagnosis not present

## 2020-07-24 DIAGNOSIS — M25561 Pain in right knee: Secondary | ICD-10-CM | POA: Diagnosis not present

## 2020-07-27 DIAGNOSIS — M25561 Pain in right knee: Secondary | ICD-10-CM | POA: Diagnosis not present

## 2020-08-02 DIAGNOSIS — M25561 Pain in right knee: Secondary | ICD-10-CM | POA: Diagnosis not present

## 2020-08-08 ENCOUNTER — Other Ambulatory Visit: Payer: Self-pay

## 2020-08-08 ENCOUNTER — Ambulatory Visit (INDEPENDENT_AMBULATORY_CARE_PROVIDER_SITE_OTHER): Payer: Medicare PPO

## 2020-08-08 ENCOUNTER — Ambulatory Visit (INDEPENDENT_AMBULATORY_CARE_PROVIDER_SITE_OTHER): Payer: Medicare PPO | Admitting: Family Medicine

## 2020-08-08 ENCOUNTER — Encounter: Payer: Self-pay | Admitting: Family Medicine

## 2020-08-08 VITALS — BP 144/64 | HR 68 | Temp 97.9°F | Ht 63.0 in | Wt 138.0 lb

## 2020-08-08 DIAGNOSIS — Z0000 Encounter for general adult medical examination without abnormal findings: Secondary | ICD-10-CM | POA: Diagnosis not present

## 2020-08-08 DIAGNOSIS — Z0100 Encounter for examination of eyes and vision without abnormal findings: Secondary | ICD-10-CM

## 2020-08-08 DIAGNOSIS — M159 Polyosteoarthritis, unspecified: Secondary | ICD-10-CM

## 2020-08-08 DIAGNOSIS — M8949 Other hypertrophic osteoarthropathy, multiple sites: Secondary | ICD-10-CM

## 2020-08-08 DIAGNOSIS — Z1231 Encounter for screening mammogram for malignant neoplasm of breast: Secondary | ICD-10-CM

## 2020-08-08 DIAGNOSIS — Z780 Asymptomatic menopausal state: Secondary | ICD-10-CM | POA: Diagnosis not present

## 2020-08-08 DIAGNOSIS — K219 Gastro-esophageal reflux disease without esophagitis: Secondary | ICD-10-CM

## 2020-08-08 DIAGNOSIS — G4700 Insomnia, unspecified: Secondary | ICD-10-CM | POA: Diagnosis not present

## 2020-08-08 MED ORDER — CELECOXIB 100 MG PO CAPS: 200.0000 mg | ORAL_CAPSULE | Freq: Every day | ORAL | 0 refills | Status: DC

## 2020-08-08 MED ORDER — OMEPRAZOLE 20 MG PO CPDR: 20.0000 mg | DELAYED_RELEASE_CAPSULE | Freq: Once | ORAL | 0 refills | Status: AC

## 2020-08-08 NOTE — Patient Instructions (Signed)
Brooke Burke , Thank you for taking time to come for your Medicare Wellness Visit. I appreciate your ongoing commitment to your health goals. Please review the following plan we discussed and let me know if I can assist you in the future.   Screening recommendations/referrals: Colonoscopy: Current Due 09/04/2024 Mammogram: referral completed 08/07/2020 Bone Density: referral  Completed 08/07/2020 Recommended yearly ophthalmology/optometry visit for glaucoma screening and checkup Recommended yearly dental visit for hygiene and checkup  Vaccinations: Influenza vaccine: current Due this fall 2022 Pneumococcal vaccine: Series completed  Tdap vaccine: due upon injury  Shingles vaccine: series completed   Advanced directives: will provide copies   Conditions/risks identified: none   Next appointment: none    Preventive Care 65 Years and Older, Female Preventive care refers to lifestyle choices and visits with your health care provider that can promote health and wellness. What does preventive care include?  A yearly physical exam. This is also called an annual well check.  Dental exams once or twice a year.  Routine eye exams. Ask your health care provider how often you should have your eyes checked.  Personal lifestyle choices, including:  Daily care of your teeth and gums.  Regular physical activity.  Eating a healthy diet.  Avoiding tobacco and drug use.  Limiting alcohol use.  Practicing safe sex.  Taking low-dose aspirin every day.  Taking vitamin and mineral supplements as recommended by your health care provider. What happens during an annual well check? The services and screenings done by your health care provider during your annual well check will depend on your age, overall health, lifestyle risk factors, and family history of disease. Counseling  Your health care provider may ask you questions about your:  Alcohol use.  Tobacco use.  Drug  use.  Emotional well-being.  Home and relationship well-being.  Sexual activity.  Eating habits.  History of falls.  Memory and ability to understand (cognition).  Work and work Astronomer.  Reproductive health. Screening  You may have the following tests or measurements:  Height, weight, and BMI.  Blood pressure.  Lipid and cholesterol levels. These may be checked every 5 years, or more frequently if you are over 38 years old.  Skin check.  Lung cancer screening. You may have this screening every year starting at age 52 if you have a 30-pack-year history of smoking and currently smoke or have quit within the past 15 years.  Fecal occult blood test (FOBT) of the stool. You may have this test every year starting at age 53.  Flexible sigmoidoscopy or colonoscopy. You may have a sigmoidoscopy every 5 years or a colonoscopy every 10 years starting at age 78.  Hepatitis C blood test.  Hepatitis B blood test.  Sexually transmitted disease (STD) testing.  Diabetes screening. This is done by checking your blood sugar (glucose) after you have not eaten for a while (fasting). You may have this done every 1-3 years.  Bone density scan. This is done to screen for osteoporosis. You may have this done starting at age 53.  Mammogram. This may be done every 1-2 years. Talk to your health care provider about how often you should have regular mammograms. Talk with your health care provider about your test results, treatment options, and if necessary, the need for more tests. Vaccines  Your health care provider may recommend certain vaccines, such as:  Influenza vaccine. This is recommended every year.  Tetanus, diphtheria, and acellular pertussis (Tdap, Td) vaccine. You may need a Td  booster every 10 years.  Zoster vaccine. You may need this after age 4.  Pneumococcal 13-valent conjugate (PCV13) vaccine. One dose is recommended after age 66.  Pneumococcal polysaccharide  (PPSV23) vaccine. One dose is recommended after age 17. Talk to your health care provider about which screenings and vaccines you need and how often you need them. This information is not intended to replace advice given to you by your health care provider. Make sure you discuss any questions you have with your health care provider. Document Released: 05/11/2015 Document Revised: 01/02/2016 Document Reviewed: 02/13/2015 Elsevier Interactive Patient Education  2017 [place] Prevention in the Home Falls can cause injuries. They can happen to people of all ages. There are many things you can do to make your home safe and to help prevent falls. What can I do on the outside of my home?  Regularly fix the edges of walkways and driveways and fix any cracks.  Remove anything that might make you trip as you walk through a door, such as a raised step or threshold.  Trim any bushes or trees on the path to your home.  Use bright outdoor lighting.  Clear any walking paths of anything that might make someone trip, such as rocks or tools.  Regularly check to see if handrails are loose or broken. Make sure that both sides of any steps have handrails.  Any raised decks and porches should have guardrails on the edges.  Have any leaves, snow, or ice cleared regularly.  Use sand or salt on walking paths during winter.  Clean up any spills in your garage right away. This includes oil or grease spills. What can I do in the bathroom?  Use night lights.  Install grab bars by the toilet and in the tub and shower. Do not use towel bars as grab bars.  Use non-skid mats or decals in the tub or shower.  If you need to sit down in the shower, use a plastic, non-slip stool.  Keep the floor dry. Clean up any water that spills on the floor as soon as it happens.  Remove soap buildup in the tub or shower regularly.  Attach bath mats securely with double-sided non-slip rug tape.  Do not have  throw rugs and other things on the floor that can make you trip. What can I do in the bedroom?  Use night lights.  Make sure that you have a light by your bed that is easy to reach.  Do not use any sheets or blankets that are too big for your bed. They should not hang down onto the floor.  Have a firm chair that has side arms. You can use this for support while you get dressed.  Do not have throw rugs and other things on the floor that can make you trip. What can I do in the kitchen?  Clean up any spills right away.  Avoid walking on wet floors.  Keep items that you use a lot in easy-to-reach places.  If you need to reach something above you, use a strong step stool that has a grab bar.  Keep electrical cords out of the way.  Do not use floor polish or wax that makes floors slippery. If you must use wax, use non-skid floor wax.  Do not have throw rugs and other things on the floor that can make you trip. What can I do with my stairs?  Do not leave any items on the stairs.  Make sure that there are handrails on both sides of the stairs and use them. Fix handrails that are broken or loose. Make sure that handrails are as long as the stairways.  Check any carpeting to make sure that it is firmly attached to the stairs. Fix any carpet that is loose or worn.  Avoid having throw rugs at the top or bottom of the stairs. If you do have throw rugs, attach them to the floor with carpet tape.  Make sure that you have a light switch at the top of the stairs and the bottom of the stairs. If you do not have them, ask someone to add them for you. What else can I do to help prevent falls?  Wear shoes that:  Do not have high heels.  Have rubber bottoms.  Are comfortable and fit you well.  Are closed at the toe. Do not wear sandals.  If you use a stepladder:  Make sure that it is fully opened. Do not climb a closed stepladder.  Make sure that both sides of the stepladder are  locked into place.  Ask someone to hold it for you, if possible.  Clearly mark and make sure that you can see:  Any grab bars or handrails.  First and last steps.  Where the edge of each step is.  Use tools that help you move around (mobility aids) if they are needed. These include:  Canes.  Walkers.  Scooters.  Crutches.  Turn on the lights when you go into a dark area. Replace any light bulbs as soon as they burn out.  Set up your furniture so you have a clear path. Avoid moving your furniture around.  If any of your floors are uneven, fix them.  If there are any pets around you, be aware of where they are.  Review your medicines with your doctor. Some medicines can make you feel dizzy. This can increase your chance of falling. Ask your doctor what other things that you can do to help prevent falls. This information is not intended to replace advice given to you by your health care provider. Make sure you discuss any questions you have with your health care provider. Document Released: 02/08/2009 Document Revised: 09/20/2015 Document Reviewed: 05/19/2014 Elsevier Interactive Patient Education  2017 [employer].

## 2020-08-08 NOTE — Progress Notes (Signed)
   Subjective:    Patient ID: Brooke Burke, female    DOB: 12-Oct-1950, 70 y.o.   MRN: [id number]  HPI Here to follow up after an absence of almost 3 years. She had been nervous about coming in due to the pandemic, but now she is getting out more. She has been seeing her orthopedist, Dr. Eugenia Mcalpine at [facility], for bilateral knee pain. She has OA in the knees, and she has had arthroscopy for meniscal tears in both knees this last year. They have been supplying her with 200 mg a day of Celebrex, but she still deals with frequent bouts of pain. She often supplements this with OTC Naproxyn. She is using Trazodone for sleep.    Review of Systems  Constitutional: Negative.   Respiratory: Negative.   Cardiovascular: Negative.   Musculoskeletal: Positive for arthralgias.       Objective:   Physical Exam Constitutional:      Appearance: Normal appearance.  Cardiovascular:     Rate and Rhythm: Normal rate and regular rhythm.     Pulses: Normal pulses.     Heart sounds: Normal heart sounds.  Pulmonary:     Effort: Pulmonary effort is normal.     Breath sounds: Normal breath sounds.  Neurological:     Mental Status: She is alert.           Assessment & Plan:  Her OA is stable, but I told her not to combine 2 different NSAID medications. She will stay on Celebrex, but rather than Naproxyn she may supplement this with ES Tylenol as needed. Her insomnia and GERD are stable. She will return soon for a well exam and for fasting labs.  Gershon Crane, MD

## 2020-08-08 NOTE — Progress Notes (Signed)
Subjective:   LAURIA DEPOY is a 70 y.o. female who presents for an Initial Medicare Annual Wellness Visit.  Review of Systems    N/a  Cardiac Risk Factors include: advanced age (>2men, >34 women);hypertension     Objective:    Today's Vitals   08/08/20 1407  BP: (!) 144/64  Pulse: 68  Temp: 97.9 F (36.6 C)  SpO2: 97%  Weight: 138 lb (62.6 kg)  Height: 5\' 3"  (1.6 m)   Body mass index is 24.45 kg/m.  Advanced Directives 08/08/2020  Does Patient Have a Medical Advance Directive? Yes  Type of Advance Directive Living will;Healthcare Power of Attorney  Does patient want to make changes to medical advance directive? No - Patient declined  Copy of Healthcare Power of Attorney in Chart? No - copy requested    Current Medications (verified) Outpatient Encounter Medications as of 08/08/2020  Medication Sig  . celecoxib (CELEBREX) 100 MG capsule Take by mouth.  . OMEPRAZOLE PO Take 20 mg by mouth once.  . traZODone (DESYREL) 100 MG tablet TAKE 1 TABLET BY MOUTH EVERYDAY AT BEDTIME  . furosemide (LASIX) 20 MG tablet Take 1 tablet (20 mg total) by mouth daily as needed for fluid. (Patient not taking: Reported on 08/08/2020)  . pseudoephedrine-acetaminophen (TYLENOL SINUS) 30-500 MG TABS tablet Take 1 tablet by mouth every 4 (four) hours as needed. (Patient not taking: Reported on 08/08/2020)   No facility-administered encounter medications on file as of 08/08/2020.    Allergies (verified) Patient has no known allergies.   History: Past Medical History:  Diagnosis Date  . Arthritis   . Asthma   . GERD (gastroesophageal reflux disease)   . Headache(784.0)   . Insomnia    Past Surgical History:  Procedure Laterality Date  . KNEE ARTHROSCOPY Bilateral   . TONSILLECTOMY     Family History  Problem Relation Age of Onset  . Diabetes Other   . Hyperlipidemia Other   . Hypertension Other    Social History   Socioeconomic History  . Marital status: Single    Spouse  name: Not on file  . Number of children: Not on file  . Years of education: Not on file  . Highest education level: Not on file  Occupational History  . Not on file  Tobacco Use  . Smoking status: Never Smoker  . Smokeless tobacco: Never Used  Substance and Sexual Activity  . Alcohol use: Yes    Alcohol/week: 0.0 standard drinks    Comment: occ  . Drug use: No  . Sexual activity: Not on file  Other Topics Concern  . Not on file  Social History Narrative  . Not on file   Social Determinants of Health   Financial Resource Strain: Low Risk   . Difficulty of Paying Living Expenses: Not hard at all  Food Insecurity: No Food Insecurity  . Worried About 08/10/2020 in the Last Year: Never true  . Ran Out of Food in the Last Year: Never true  Transportation Needs: No Transportation Needs  . Lack of Transportation (Medical): No  . Lack of Transportation (Non-Medical): No  Physical Activity: Insufficiently Active  . Days of Exercise per Week: 3 days  . Minutes of Exercise per Session: 30 min  Stress: No Stress Concern Present  . Feeling of Stress : Not at all  Social Connections: Moderately Integrated  . Frequency of Communication with Friends and Family: More than three times a week  . Frequency of  Social Gatherings with Friends and Family: More than three times a week  . Attends Religious Services: 1 to 4 times per year  . Active Member of Clubs or Organizations: Yes  . Attends Banker Meetings: 1 to 4 times per year  . Marital Status: Divorced    Tobacco Counseling Counseling given: Not Answered   Clinical Intake:  Pre-visit preparation completed: Yes  Pain : No/denies pain     Nutritional Risks: None Diabetes: No  What is the last grade level you completed in school?: college  Diabetic?no  Interpreter Needed?: No  Information entered by :: L.Ludie Hudon,LPN   Activities of Daily Living In your present state of health, do you have any  difficulty performing the following activities: 08/08/2020  Hearing? N  Vision? N  Difficulty concentrating or making decisions? N  Walking or climbing stairs? N  Dressing or bathing? N  Doing errands, shopping? N  Preparing Food and eating ? N  Using the Toilet? N  In the past six months, have you accidently leaked urine? N  Do you have problems with loss of bowel control? N  Managing your Medications? N  Managing your Finances? N  Housekeeping or managing your Housekeeping? N  Some recent data might be hidden    Patient Care Team: Nelwyn Salisbury, MD as PCP - General  Indicate any recent Medical Services you may have received from other than [facility] providers in the past year (date may be approximate).     Assessment:   This is a routine wellness examination for Jamielyn.  Hearing/Vision screen  Hearing Screening   125Hz  250Hz  500Hz  1000Hz  2000Hz  3000Hz  4000Hz  6000Hz  8000Hz   Right ear:           Left ear:           Vision Screening Comments: Wears glass referral eye doctor completed 08/07/2020   Dietary issues and exercise activities discussed: Current Exercise Habits: Home exercise routine, Type of exercise: walking;Other - see comments (swimming), Time (Minutes): 30, Frequency (Times/Week): 3, Weekly Exercise (Minutes/Week): 90, Intensity: Mild  Goals    . Exercise 3x per week (30 min per time)     Swim 2 x week for knee       Depression Screen PHQ 2/9 Scores 08/08/2020 10/26/2017 09/28/2015  PHQ - 2 Score 0 0 0    Fall Risk Fall Risk  08/08/2020 10/26/2017 09/28/2015  Falls in the past year? 0 No No  Number falls in past yr: 0 - -  Injury with Fall? 0 - -  Follow up Falls evaluation completed - -    FALL RISK PREVENTION PERTAINING TO THE HOME:  Any stairs in or around the home? No  If so, are there any without handrails? Yes  Home free of loose throw rugs in walkways, pet beds, electrical cords, etc? Yes  Adequate lighting in your home to reduce risk of falls? Yes    ASSISTIVE DEVICES UTILIZED TO PREVENT FALLS:  Life alert? No  Use of a cane, walker or w/c? No  Grab bars in the bathroom? No  Shower chair or bench in shower? No  Elevated toilet seat or a handicapped toilet? No   TIMED UP AND GO:  Was the test performed? Yes .  Length of time to ambulate 10 feet: 5 sec.   Gait steady and fast without use of assistive device  Cognitive Function:     Normal cognitive status assessed by direct observation by this Nurse Health Advisor. No  abnormalities found.      Immunizations Immunization History  Administered Date(s) Administered  . Influenza, High Dose Seasonal PF 02/20/2015, 02/12/2016, 02/10/2017  . Influenza,inj,quad, With Preservative 03/09/2014  . Influenza-Unspecified 02/12/2016, 02/10/2017, 02/09/2018  . PFIZER(Purple Top)SARS-COV-2 Vaccination 02/25/2020  . Pneumococcal Conjugate-13 02/20/2015  . Pneumococcal Polysaccharide-23 02/12/2016  . Td 12/24/2005  . Tdap 09/03/2010  . Zoster 02/25/2012  . Zoster Recombinat (Shingrix) 02/09/2018    TDAP status: Due, Education has been provided regarding the importance of this vaccine. Advised may receive this vaccine at local pharmacy or Health Dept. Aware to provide a copy of the vaccination record if obtained from local pharmacy or Health Dept. Verbalized acceptance and understanding.  Flu Vaccine status: Up to date  Pneumococcal vaccine status: Up to date  Covid-19 vaccine status: Completed vaccines  Qualifies for Shingles Vaccine? Yes   Zostavax completed Yes   Shingrix Completed?: Yes  Screening Tests Health Maintenance  Topic Date Due  . Hepatitis C Screening  Never done  . DEXA SCAN  Never done  . MAMMOGRAM  10/11/2016  . COVID-19 Vaccine (2 - Pfizer 3-dose series) 03/17/2020  . TETANUS/TDAP  09/02/2020  . INFLUENZA VACCINE  11/26/2020  . COLONOSCOPY (Pts 45-22yrs Insurance coverage will need to be confirmed)  09/04/2024  . PNA vac Low Risk Adult  Completed  . HPV  VACCINES  Aged Out    Health Maintenance  Health Maintenance Due  Topic Date Due  . Hepatitis C Screening  Never done  . DEXA SCAN  Never done  . MAMMOGRAM  10/11/2016  . COVID-19 Vaccine (2 - Pfizer 3-dose series) 03/17/2020    Colorectal cancer screening: Type of screening: Colonoscopy. Completed 09/05/2014. Repeat every 10 years  Mammogram status: Ordered 08/07/2020. Pt provided with contact info and advised to call to schedule appt.   Bone Density status: Ordered 08/07/2020. Pt provided with contact info and advised to call to schedule appt.  Lung Cancer Screening: (Low Dose CT Chest recommended if Age 28-80 years, 30 pack-year currently smoking OR have quit w/in 15years.) does not qualify.   Lung Cancer Screening Referral: n/a  Additional Screening:  Hepatitis C Screening: does qualify  Vision Screening: Recommended annual ophthalmology exams for early detection of glaucoma and other disorders of the eye. Is the patient up to date with their annual eye exam?  No  Who is the provider or what is the name of the office in which the patient attends annual eye exams? Referral completed 08/07/2020 If pt is not established with a provider, would they like to be referred to a provider to establish care? No    .   Dental Screening: Recommended annual dental exams for proper oral hygiene  Community Resource Referral / Chronic Care Management: CRR required this visit?  No   CCM required this visit?  No      Plan:     I have personally reviewed and noted the following in the patient's chart:   . Medical and social history . Use of alcohol, tobacco or illicit drugs  . Current medications and supplements . Functional ability and status . Nutritional status . Physical activity . Advanced directives . List of other physicians . Hospitalizations, surgeries, and ER visits in previous 12 months . Vitals . Screenings to include cognitive, depression, and falls . Referrals  and appointments  In addition, I have reviewed and discussed with patient certain preventive protocols, quality metrics, and best practice recommendations. A written personalized care plan for preventive services as  well as general preventive health recommendations were provided to patient.     March RummageLaura Abdulkareem Badolato, LPN   [id number]/13/2022   Nurse Notes:none

## 2020-08-13 DIAGNOSIS — M1712 Unilateral primary osteoarthritis, left knee: Secondary | ICD-10-CM | POA: Diagnosis not present

## 2020-08-13 DIAGNOSIS — M170 Bilateral primary osteoarthritis of knee: Secondary | ICD-10-CM | POA: Diagnosis not present

## 2020-08-13 DIAGNOSIS — Z4789 Encounter for other orthopedic aftercare: Secondary | ICD-10-CM | POA: Diagnosis not present

## 2020-08-13 DIAGNOSIS — M1711 Unilateral primary osteoarthritis, right knee: Secondary | ICD-10-CM | POA: Diagnosis not present

## 2020-09-03 ENCOUNTER — Other Ambulatory Visit: Payer: Self-pay

## 2020-09-04 ENCOUNTER — Ambulatory Visit (INDEPENDENT_AMBULATORY_CARE_PROVIDER_SITE_OTHER): Payer: Medicare PPO | Admitting: Family Medicine

## 2020-09-04 ENCOUNTER — Encounter: Payer: Self-pay | Admitting: Family Medicine

## 2020-09-04 VITALS — BP 130/78 | HR 73 | Temp 97.8°F | Ht 63.0 in | Wt 136.0 lb

## 2020-09-04 DIAGNOSIS — Z0000 Encounter for general adult medical examination without abnormal findings: Secondary | ICD-10-CM

## 2020-09-04 DIAGNOSIS — Z209 Contact with and (suspected) exposure to unspecified communicable disease: Secondary | ICD-10-CM

## 2020-09-04 DIAGNOSIS — M810 Age-related osteoporosis without current pathological fracture: Secondary | ICD-10-CM

## 2020-09-04 DIAGNOSIS — Z86010 Personal history of colonic polyps: Secondary | ICD-10-CM | POA: Diagnosis not present

## 2020-09-04 DIAGNOSIS — Z23 Encounter for immunization: Secondary | ICD-10-CM | POA: Diagnosis not present

## 2020-09-04 LAB — LIPID PANEL
Cholesterol: 217 mg/dL — ABNORMAL HIGH (ref 0–200)
HDL: 64.1 mg/dL (ref >39.00)
LDL Cholesterol: 125 mg/dL — ABNORMAL HIGH (ref 0–99)
NonHDL: 153.27
Total CHOL/HDL Ratio: 3
Triglycerides: 142 mg/dL (ref 0.0–149.0)
VLDL: 28.4 mg/dL (ref 0.0–40.0)

## 2020-09-04 LAB — CBC WITH DIFFERENTIAL/PLATELET
Basophils Absolute: 0 10*3/uL (ref 0.0–0.1)
Basophils Relative: 0.6 % (ref 0.0–3.0)
Eosinophils Absolute: 0.1 10*3/uL (ref 0.0–0.7)
Eosinophils Relative: 2.3 % (ref 0.0–5.0)
HCT: 37.1 % (ref 36.0–46.0)
Hemoglobin: 12.6 g/dL (ref 12.0–15.0)
Lymphocytes Relative: 24.7 % (ref 12.0–46.0)
Lymphs Abs: 1.2 10*3/uL (ref 0.7–4.0)
MCHC: 33.9 g/dL (ref 30.0–36.0)
MCV: 90.7 fl (ref 78.0–100.0)
Monocytes Absolute: 0.2 10*3/uL (ref 0.1–1.0)
Monocytes Relative: 4.9 % (ref 3.0–12.0)
Neutro Abs: 3.3 10*3/uL (ref 1.4–7.7)
Neutrophils Relative %: 67.5 % (ref 43.0–77.0)
Platelets: 253 10*3/uL (ref 150.0–400.0)
RBC: 4.09 Mil/uL (ref 3.87–5.11)
RDW: 14.1 % (ref 11.5–15.5)
WBC: 4.9 10*3/uL (ref 4.0–10.5)

## 2020-09-04 LAB — HEPATIC FUNCTION PANEL
ALT: 40 U/L — ABNORMAL HIGH (ref 0–35)
AST: 35 U/L (ref 0–37)
Albumin: 4 g/dL (ref 3.5–5.2)
Alkaline Phosphatase: 104 U/L (ref 39–117)
Bilirubin, Direct: 0.1 mg/dL (ref 0.0–0.3)
Total Bilirubin: 0.3 mg/dL (ref 0.2–1.2)
Total Protein: 6.1 g/dL (ref 6.0–8.3)

## 2020-09-04 LAB — T4, FREE: Free T4: 0.82 ng/dL (ref 0.60–1.60)

## 2020-09-04 LAB — TSH: TSH: 1.94 u[IU]/mL (ref 0.35–4.50)

## 2020-09-04 LAB — BASIC METABOLIC PANEL
BUN: 16 mg/dL (ref 6–23)
CO2: 30 mEq/L (ref 19–32)
Calcium: 9.2 mg/dL (ref 8.4–10.5)
Chloride: 104 mEq/L (ref 96–112)
Creatinine, Ser: 0.92 mg/dL (ref 0.40–1.20)
GFR: 62.78 mL/min (ref >60.00)
Glucose, Bld: 86 mg/dL (ref 70–99)
Potassium: 4.5 mEq/L (ref 3.5–5.1)
Sodium: 140 mEq/L (ref 135–145)

## 2020-09-04 LAB — HEMOGLOBIN A1C: Hgb A1c MFr Bld: 5.4 % (ref 4.6–6.5)

## 2020-09-04 LAB — T3, FREE: T3, Free: 2.6 pg/mL (ref 2.3–4.2)

## 2020-09-04 NOTE — Addendum Note (Signed)
Addended by: Carola Rhine on: 09/04/2020 09:09 AM   Modules accepted: Orders

## 2020-09-04 NOTE — Progress Notes (Signed)
   Subjective:    Patient ID: Brooke Burke, female    DOB: Oct 11, 1950, 70 y.o.   MRN: [id number]  HPI Here for a well exam. She is doing well.    Review of Systems  Constitutional: Negative.   HENT: Negative.   Eyes: Negative.   Respiratory: Negative.   Cardiovascular: Negative.   Gastrointestinal: Negative.   Genitourinary: Negative for decreased urine volume, difficulty urinating, dyspareunia, dysuria, enuresis, flank pain, frequency, hematuria, pelvic pain and urgency.  Musculoskeletal: Positive for arthralgias.  Skin: Negative.   Neurological: Negative.   Psychiatric/Behavioral: Negative.        Objective:   Physical Exam Constitutional:      General: She is not in acute distress.    Appearance: Normal appearance. She is well-developed.  HENT:     Head: Normocephalic and atraumatic.     Right Ear: External ear normal.     Left Ear: External ear normal.     Nose: Nose normal.     Mouth/Throat:     Pharynx: No oropharyngeal exudate.  Eyes:     General: No scleral icterus.    Conjunctiva/sclera: Conjunctivae normal.     Pupils: Pupils are equal, round, and reactive to light.  Neck:     Thyroid: No thyromegaly.     Vascular: No JVD.  Cardiovascular:     Rate and Rhythm: Normal rate and regular rhythm.     Heart sounds: Normal heart sounds. No murmur heard. No friction rub. No gallop.   Pulmonary:     Effort: Pulmonary effort is normal. No respiratory distress.     Breath sounds: Normal breath sounds. No wheezing or rales.  Chest:     Chest wall: No tenderness.  Abdominal:     General: Bowel sounds are normal. There is no distension.     Palpations: Abdomen is soft. There is no mass.     Tenderness: There is no abdominal tenderness. There is no guarding or rebound.  Musculoskeletal:        General: No tenderness. Normal range of motion.     Cervical back: Normal range of motion and neck supple.  Lymphadenopathy:     Cervical: No cervical adenopathy.  Skin:     General: Skin is warm and dry.     Findings: No erythema or rash.  Neurological:     Mental Status: She is alert and oriented to person, place, and time.     Cranial Nerves: No cranial nerve deficit.     Motor: No abnormal muscle tone.     Coordination: Coordination normal.     Deep Tendon Reflexes: Reflexes are normal and symmetric. Reflexes normal.  Psychiatric:        Behavior: Behavior normal.        Thought Content: Thought content normal.        Judgment: Judgment normal.           Assessment & Plan:  Well exam. We discussed diet and exercise. Get fasting labs. Refer back to Dr. Kinnie Scales for a colonoscopy. Set up a DEXA. She will arrange for a mammogram.  Gershon Crane, MD

## 2020-09-05 LAB — HEPATITIS C ANTIBODY
Hepatitis C Ab: NONREACTIVE
SIGNAL TO CUT-OFF: 0.01 (ref <1.00)

## 2020-09-10 ENCOUNTER — Inpatient Hospital Stay: Admission: RE | Admit: 2020-09-10 | Payer: Medicare PPO | Source: Ambulatory Visit

## 2020-09-11 ENCOUNTER — Other Ambulatory Visit: Payer: Self-pay

## 2020-09-11 ENCOUNTER — Ambulatory Visit (INDEPENDENT_AMBULATORY_CARE_PROVIDER_SITE_OTHER)
Admission: RE | Admit: 2020-09-11 | Discharge: 2020-09-11 | Disposition: A | Discharge status: Home / Self Care | Payer: Medicare PPO | Source: Ambulatory Visit | Attending: Family Medicine | Admitting: Family Medicine

## 2020-09-11 DIAGNOSIS — M810 Age-related osteoporosis without current pathological fracture: Secondary | ICD-10-CM

## 2020-10-24 DIAGNOSIS — M1711 Unilateral primary osteoarthritis, right knee: Secondary | ICD-10-CM | POA: Diagnosis not present

## 2020-10-31 DIAGNOSIS — M1711 Unilateral primary osteoarthritis, right knee: Secondary | ICD-10-CM | POA: Diagnosis not present

## 2020-11-07 DIAGNOSIS — M1711 Unilateral primary osteoarthritis, right knee: Secondary | ICD-10-CM | POA: Diagnosis not present

## 2020-11-07 DIAGNOSIS — M170 Bilateral primary osteoarthritis of knee: Secondary | ICD-10-CM | POA: Diagnosis not present

## 2020-11-07 DIAGNOSIS — M1712 Unilateral primary osteoarthritis, left knee: Secondary | ICD-10-CM | POA: Diagnosis not present

## 2020-11-13 ENCOUNTER — Other Ambulatory Visit: Payer: Self-pay | Admitting: Family Medicine

## 2020-11-13 NOTE — Telephone Encounter (Signed)
Last office visit- 09/04/20 Last refill- 02/14/20--90 tabs, 2 refills  No future visit scheduled

## 2020-12-26 DIAGNOSIS — M170 Bilateral primary osteoarthritis of knee: Secondary | ICD-10-CM | POA: Diagnosis not present

## 2021-02-28 DIAGNOSIS — M170 Bilateral primary osteoarthritis of knee: Secondary | ICD-10-CM | POA: Diagnosis not present

## 2021-05-09 DIAGNOSIS — M1711 Unilateral primary osteoarthritis, right knee: Secondary | ICD-10-CM | POA: Diagnosis not present

## 2021-05-16 DIAGNOSIS — M1712 Unilateral primary osteoarthritis, left knee: Secondary | ICD-10-CM | POA: Diagnosis not present

## 2021-05-23 DIAGNOSIS — M170 Bilateral primary osteoarthritis of knee: Secondary | ICD-10-CM | POA: Diagnosis not present

## 2021-05-28 DIAGNOSIS — M5451 Vertebrogenic low back pain: Secondary | ICD-10-CM | POA: Diagnosis not present

## 2021-05-30 DIAGNOSIS — M1712 Unilateral primary osteoarthritis, left knee: Secondary | ICD-10-CM | POA: Diagnosis not present

## 2021-06-06 DIAGNOSIS — M1712 Unilateral primary osteoarthritis, left knee: Secondary | ICD-10-CM | POA: Diagnosis not present

## 2021-06-17 DIAGNOSIS — M5416 Radiculopathy, lumbar region: Secondary | ICD-10-CM | POA: Diagnosis not present

## 2021-06-17 DIAGNOSIS — M5451 Vertebrogenic low back pain: Secondary | ICD-10-CM | POA: Diagnosis not present

## 2021-07-01 DIAGNOSIS — L821 Other seborrheic keratosis: Secondary | ICD-10-CM | POA: Diagnosis not present

## 2021-07-01 DIAGNOSIS — D225 Melanocytic nevi of trunk: Secondary | ICD-10-CM | POA: Diagnosis not present

## 2021-07-01 DIAGNOSIS — D224 Melanocytic nevi of scalp and neck: Secondary | ICD-10-CM | POA: Diagnosis not present

## 2021-07-01 DIAGNOSIS — D2272 Melanocytic nevi of left lower limb, including hip: Secondary | ICD-10-CM | POA: Diagnosis not present

## 2021-07-01 DIAGNOSIS — D1801 Hemangioma of skin and subcutaneous tissue: Secondary | ICD-10-CM | POA: Diagnosis not present

## 2021-07-01 DIAGNOSIS — D2262 Melanocytic nevi of left upper limb, including shoulder: Secondary | ICD-10-CM | POA: Diagnosis not present

## 2021-07-01 DIAGNOSIS — D2271 Melanocytic nevi of right lower limb, including hip: Secondary | ICD-10-CM | POA: Diagnosis not present

## 2021-07-01 DIAGNOSIS — L812 Freckles: Secondary | ICD-10-CM | POA: Diagnosis not present

## 2021-07-01 DIAGNOSIS — Z85828 Personal history of other malignant neoplasm of skin: Secondary | ICD-10-CM | POA: Diagnosis not present

## 2021-07-15 DIAGNOSIS — M5416 Radiculopathy, lumbar region: Secondary | ICD-10-CM | POA: Diagnosis not present

## 2021-07-15 DIAGNOSIS — M5451 Vertebrogenic low back pain: Secondary | ICD-10-CM | POA: Diagnosis not present

## 2021-07-18 DIAGNOSIS — M5416 Radiculopathy, lumbar region: Secondary | ICD-10-CM | POA: Diagnosis not present

## 2021-07-18 DIAGNOSIS — M5451 Vertebrogenic low back pain: Secondary | ICD-10-CM | POA: Diagnosis not present

## 2021-07-22 DIAGNOSIS — M5416 Radiculopathy, lumbar region: Secondary | ICD-10-CM | POA: Diagnosis not present

## 2021-07-22 DIAGNOSIS — M5451 Vertebrogenic low back pain: Secondary | ICD-10-CM | POA: Diagnosis not present

## 2021-07-25 DIAGNOSIS — M5416 Radiculopathy, lumbar region: Secondary | ICD-10-CM | POA: Diagnosis not present

## 2021-07-25 DIAGNOSIS — M5451 Vertebrogenic low back pain: Secondary | ICD-10-CM | POA: Diagnosis not present

## 2021-07-31 DIAGNOSIS — M5451 Vertebrogenic low back pain: Secondary | ICD-10-CM | POA: Diagnosis not present

## 2021-07-31 DIAGNOSIS — M5136 Other intervertebral disc degeneration, lumbar region: Secondary | ICD-10-CM | POA: Diagnosis not present

## 2021-08-09 ENCOUNTER — Ambulatory Visit (INDEPENDENT_AMBULATORY_CARE_PROVIDER_SITE_OTHER): Payer: Medicare PPO

## 2021-08-09 VITALS — BP 120/60 | HR 77 | Temp 98.4°F | Ht 63.0 in | Wt 135.8 lb

## 2021-08-09 DIAGNOSIS — Z0000 Encounter for general adult medical examination without abnormal findings: Secondary | ICD-10-CM | POA: Diagnosis not present

## 2021-08-09 NOTE — Progress Notes (Signed)
? ?Subjective:  ? Brooke Burke is a 71 y.o. female who presents for Medicare Annual (Subsequent) preventive examination. ? ?Review of Systems    ? ? ?   ?Objective:  ?  ?Today's Vitals  ? 08/09/21 1544  ?BP: 120/60  ?Pulse: 77  ?Temp: 98.4 ?F (36.9 ?C)  ?SpO2: 96%  ?Weight: 135 lb 12.8 oz (61.6 kg)  ?Height: 5\' 3"  (1.6 m)  ? ?Body mass index is 24.06 kg/m?. ? ? ?  08/09/2021  ?  4:00 PM 08/08/2020  ?  2:25 PM  ?Advanced Directives  ?Does Patient Have a Medical Advance Directive? Yes Yes  ?Type of Estate agentAdvance Directive Healthcare Power of [place] HallAttorney;Living will Living will;Healthcare Power of Attorney  ?Does patient want to make changes to medical advance directive? No - Patient declined No - Patient declined  ?Copy of Healthcare Power of Attorney in Chart? No - copy requested No - copy requested  ? ? ?Current Medications (verified) ?Outpatient Encounter Medications as of 08/09/2021  ?Medication Sig  ? meloxicam (MOBIC) 7.5 MG tablet Take 7.5 mg by mouth daily.  ? traZODone (DESYREL) 100 MG tablet TAKE 1 TABLET BY MOUTH EVERYDAY AT BEDTIME  ? celecoxib (CELEBREX) 100 MG capsule Take 2 capsules (200 mg total) by mouth daily. (Patient not taking: Reported on 08/09/2021)  ? omeprazole (PRILOSEC) 20 MG capsule Take 1 capsule (20 mg total) by mouth once for 1 dose.  ? traMADol (ULTRAM) 50 MG tablet Take 50 mg by mouth every 6 (six) hours as needed. (Patient not taking: Reported on 08/09/2021)  ? ?No facility-administered encounter medications on file as of 08/09/2021.  ? ? ?Allergies (verified) ?Patient has no known allergies.  ? ?History: ?Past Medical History:  ?Diagnosis Date  ? Arthritis   ? Asthma   ? GERD (gastroesophageal reflux disease)   ? Headache(784.0)   ? Insomnia   ? ?Past Surgical History:  ?Procedure Laterality Date  ? COLONOSCOPY  09/05/2014  ? per Dr. Kinnie ScalesMedoff, precancerous polyp, repeat in 5 yrs   ? KNEE ARTHROSCOPY Bilateral   ? TONSILLECTOMY    ? ?Family History  ?Problem Relation Age of Onset  ? Diabetes Other    ? Hyperlipidemia Other   ? Hypertension Other   ? ?Social History  ? ?Socioeconomic History  ? Marital status: Single  ?  Spouse name: Not on file  ? Number of children: Not on file  ? Years of education: Not on file  ? Highest education level: Not on file  ?Occupational History  ? Not on file  ?Tobacco Use  ? Smoking status: Never  ? Smokeless tobacco: Never  ?Substance and Sexual Activity  ? Alcohol use: Yes  ?  Alcohol/week: 0.0 standard drinks  ?  Comment: occ  ? Drug use: No  ? Sexual activity: Not on file  ?Other Topics Concern  ? Not on file  ?Social History Narrative  ? Not on file  ? ?Social Determinants of Health  ? ?Financial Resource Strain: Low Risk   ? Difficulty of Paying Living Expenses: Not hard at all  ?Food Insecurity: No Food Insecurity  ? Worried About Programme researcher, broadcasting/film/videounning Out of Food in the Last Year: Never true  ? Ran Out of Food in the Last Year: Never true  ?Transportation Needs: No Transportation Needs  ? Lack of Transportation (Medical): No  ? Lack of Transportation (Non-Medical): No  ?Physical Activity: Sufficiently Active  ? Days of Exercise per Week: 5 days  ? Minutes of Exercise per Session: 30  min  ?Stress: No Stress Concern Present  ? Feeling of Stress : Not at all  ?Social Connections: Moderately Integrated  ? Frequency of Communication with Friends and Family: More than three times a week  ? Frequency of Social Gatherings with Friends and Family: More than three times a week  ? Attends Religious Services: More than 4 times per year  ? Active Member of Clubs or Organizations: Yes  ? Attends Banker Meetings: More than 4 times per year  ? Marital Status: Divorced  ? ? ?Tobacco Counseling ?Counseling given: Not Answered ? ? ?Clinical Intake: ? ?Diabetic?  No ? ?Activities of Daily Living ? ?  08/09/2021  ?  3:57 PM  ?In your present state of health, do you have any difficulty performing the following activities:  ?Hearing? 0  ?Vision? 0  ?Difficulty concentrating or making decisions?  0  ?Walking or climbing stairs? 0  ?Dressing or bathing? 0  ?Doing errands, shopping? 0  ?Preparing Food and eating ? N  ?Using the Toilet? N  ?In the past six months, have you accidently leaked urine? N  ?Do you have problems with loss of bowel control? N  ?Managing your Medications? N  ?Managing your Finances? N  ?Housekeeping or managing your Housekeeping? N  ? ? ?Patient Care Team: ?Nelwyn Salisbury, MD as PCP - General ? ?Indicate any recent Medical Services you may have received from other than [facility] providers in the past year (date may be approximate). ? ?   ?Assessment:  ? This is a routine wellness examination for Jalana. ? ?Hearing/Vision screen ?Hearing Screening - Comments:: No hearing difficulty ?Vision Screening - Comments:: Wears reading glasses.  ? ?Dietary issues and exercise activities discussed: ?Exercise limited by: None identified ? ? Goals Addressed   ? ?  ?  ?  ?  ?  ? This Visit's Progress  ?   Exercise 3x per week (30 min per time) (pt-stated)     ?   I would like to walk more and continue exercises. ?  ? ?  ? ?Depression Screen ? ?  08/09/2021  ?  3:54 PM 09/04/2020  ?  8:18 AM 08/08/2020  ?  2:21 PM 10/26/2017  ?  3:46 PM 09/28/2015  ? 11:02 AM  ?PHQ 2/9 Scores  ?PHQ - 2 Score 0 0 0 0 0  ?PHQ- 9 Score  2     ?  ?Fall Risk ? ?  08/09/2021  ?  3:58 PM 09/04/2020  ?  8:01 AM 08/08/2020  ?  2:28 PM 10/26/2017  ?  3:46 PM 09/28/2015  ? 11:02 AM  ?Fall Risk   ?Falls in the past year? 0 0 0 No No  ?Number falls in past yr: 0 0 0    ?Injury with Fall? 0 0 0    ?Risk for fall due to : No Fall Risks No Fall Risks     ?Follow up   Falls evaluation completed    ? ? ?FALL RISK PREVENTION PERTAINING TO THE HOME: ? ?Any stairs in or around the home? Yes  ?If so, are there any without handrails? Yes  ?Home free of loose throw rugs in walkways, pet beds, electrical cords, etc? Yes  ?Adequate lighting in your home to reduce risk of falls? Yes  ? ?ASSISTIVE DEVICES UTILIZED TO PREVENT FALLS: ? ?Life alert? No  ?Use of a cane,  walker or w/c? No  ?Grab bars in the bathroom? No  ?Shower chair or  bench in shower? No  ?Elevated toilet seat or a handicapped toilet? No  ? ?TIMED UP AND GO: ? ?Was the test performed? Yes .  ?Length of time to ambulate 10 feet: 5 ? sec.  ? ?Gait steady and fast without use of assistive device ? ?Cognitive Function: ?  ? ?Immunizations ?Immunization History  ?Administered Date(s) Administered  ? Influenza, High Dose Seasonal PF 02/20/2015, 02/12/2016, 02/10/2017  ? Influenza,inj,quad, With Preservative 03/09/2014  ? Influenza-Unspecified 02/12/2016, 02/10/2017, 02/09/2018  ? PFIZER(Purple Top)SARS-COV-2 Vaccination 06/04/2019, 06/25/2019, 02/25/2020  ? Pneumococcal Conjugate-13 02/20/2015  ? Pneumococcal Polysaccharide-23 02/12/2016  ? Td 12/24/2005  ? Tdap 09/03/2010, 09/04/2020  ? Zoster Recombinat (Shingrix) 02/09/2018  ? Zoster, Live 02/25/2012  ? ? ?TDAP status: Up to date ? ?Pneumococcal vaccine status: Up to date ? ?Covid-19 vaccine status: Completed vaccines ? ?Qualifies for Shingles Vaccine? Yes   ?Zostavax completed Yes   ?Shingrix Completed?: Yes ? ?Screening Tests ?Health Maintenance  ?Topic Date Due  ? COVID-19 Vaccine (4 - Booster for Pfizer series) 08/25/2021 (Originally 04/21/2020)  ? MAMMOGRAM  08/10/2022 (Originally 10/11/2016)  ? INFLUENZA VACCINE  11/26/2021  ? COLONOSCOPY (Pts 45-56yrs Insurance coverage will need to be confirmed)  09/04/2024  ? TETANUS/TDAP  09/05/2030  ? Pneumonia Vaccine 89+ Years old  Completed  ? DEXA SCAN  Completed  ? Hepatitis C Screening  Completed  ? Zoster Vaccines- Shingrix  Completed  ? HPV VACCINES  Aged Out  ? ? ?Health Maintenance ? ?There are no preventive care reminders to display for this patient. ? ? ?Colorectal cancer screening: Type of screening: Colonoscopy. Completed 09/05/14. Repeat every 10 years ? ?Mammogram status: Ordered 08/09/21. Pt provided with contact info and advised to call to schedule appt.  ? ?Bone Density status: Completed 09/11/20. Results  reflect: Bone density results: OSTEOPOROSIS. Repeat every 2 years. ? ?Lung Cancer Screening: (Low Dose CT Chest recommended if Age 44-80 years, 30 pack-year currently smoking OR have quit w/in 15years.)

## 2021-08-09 NOTE — Patient Instructions (Addendum)
?Brooke Burke , ?Thank you for taking time to come for your Medicare Wellness Visit. I appreciate your ongoing commitment to your health goals. Please review the following plan we discussed and let me know if I can assist you in the future.  ? ?These are the goals we discussed: ? Goals   ? ?   Exercise 3x per week (30 min per time) (pt-stated)   ?   I would like to walk more and continue exercises. ?  ? ?  ?  ?This is a list of the screening recommended for you and due dates:  ?Health Maintenance  ?Topic Date Due  ? COVID-19 Vaccine (4 - Booster for Pfizer series) 08/25/2021*  ? Mammogram  08/10/2022*  ? Flu Shot  11/26/2021  ? Colon Cancer Screening  09/04/2024  ? Tetanus Vaccine  09/05/2030  ? Pneumonia Vaccine  Completed  ? DEXA scan (bone density measurement)  Completed  ? Hepatitis C Screening: USPSTF Recommendation to screen - Ages 29-79 yo.  Completed  ? Zoster (Shingles) Vaccine  Completed  ? HPV Vaccine  Aged Out  ?*Topic was postponed. The date shown is not the original due date.  ?  ?Advanced directives: Yes Patient will bring copy ? ?Conditions/risks identified: None ? ?Next appointment: Follow up in one year for your annual wellness visit  ? ? ?Preventive Care 40 Years and Older, Female ?Preventive care refers to lifestyle choices and visits with your health care provider that can promote health and wellness. ?What does preventive care include? ?A yearly physical exam. This is also called an annual well check. ?Dental exams once or twice a year. ?Routine eye exams. Ask your health care provider how often you should have your eyes checked. ?Personal lifestyle choices, including: ?Daily care of your teeth and gums. ?Regular physical activity. ?Eating a healthy diet. ?Avoiding tobacco and drug use. ?Limiting alcohol use. ?Practicing safe sex. ?Taking low-dose aspirin every day. ?Taking vitamin and mineral supplements as recommended by your health care provider. ?What happens during an annual well  check? ?The services and screenings done by your health care provider during your annual well check will depend on your age, overall health, lifestyle risk factors, and family history of disease. ?Counseling  ?Your health care provider may ask you questions about your: ?Alcohol use. ?Tobacco use. ?Drug use. ?Emotional well-being. ?Home and relationship well-being. ?Sexual activity. ?Eating habits. ?History of falls. ?Memory and ability to understand (cognition). ?Work and work Astronomer. ?Reproductive health. ?Screening  ?You may have the following tests or measurements: ?Height, weight, and BMI. ?Blood pressure. ?Lipid and cholesterol levels. These may be checked every 5 years, or more frequently if you are over 62 years old. ?Skin check. ?Lung cancer screening. You may have this screening every year starting at age 20 if you have a 30-pack-year history of smoking and currently smoke or have quit within the past 15 years. ?Fecal occult blood test (FOBT) of the stool. You may have this test every year starting at age 66. ?Flexible sigmoidoscopy or colonoscopy. You may have a sigmoidoscopy every 5 years or a colonoscopy every 10 years starting at age 58. ?Hepatitis C blood test. ?Hepatitis B blood test. ?Sexually transmitted disease (STD) testing. ?Diabetes screening. This is done by checking your blood sugar (glucose) after you have not eaten for a while (fasting). You may have this done every 1-3 years. ?Bone density scan. This is done to screen for osteoporosis. You may have this done starting at age 32. ?Mammogram. This may  be done every 1-2 years. Talk to your health care provider about how often you should have regular mammograms. ?Talk with your health care provider about your test results, treatment options, and if necessary, the need for more tests. ?Vaccines  ?Your health care provider may recommend certain vaccines, such as: ?Influenza vaccine. This is recommended every year. ?Tetanus, diphtheria, and  acellular pertussis (Tdap, Td) vaccine. You may need a Td booster every 10 years. ?Zoster vaccine. You may need this after age 49. ?Pneumococcal 13-valent conjugate (PCV13) vaccine. One dose is recommended after age 29. ?Pneumococcal polysaccharide (PPSV23) vaccine. One dose is recommended after age 81. ?Talk to your health care provider about which screenings and vaccines you need and how often you need them. ?This information is not intended to replace advice given to you by your health care provider. Make sure you discuss any questions you have with your health care provider. ?Document Released: 05/11/2015 Document Revised: 01/02/2016 Document Reviewed: 02/13/2015 ?Elsevier Interactive Patient Education ? 2017 [employer] ? ?Fall Prevention in the Home ?Falls can cause injuries. They can happen to people of all ages. There are many things you can do to make your home safe and to help prevent falls. ?What can I do on the outside of my home? ?Regularly fix the edges of walkways and driveways and fix any cracks. ?Remove anything that might make you trip as you walk through a door, such as a raised step or threshold. ?Trim any bushes or trees on the path to your home. ?Use bright outdoor lighting. ?Clear any walking paths of anything that might make someone trip, such as rocks or tools. ?Regularly check to see if handrails are loose or broken. Make sure that both sides of any steps have handrails. ?Any raised decks and porches should have guardrails on the edges. ?Have any leaves, snow, or ice cleared regularly. ?Use sand or salt on walking paths during winter. ?Clean up any spills in your garage right away. This includes oil or grease spills. ?What can I do in the bathroom? ?Use night lights. ?Install grab bars by the toilet and in the tub and shower. Do not use towel bars as grab bars. ?Use non-skid mats or decals in the tub or shower. ?If you need to sit down in the shower, use a plastic, non-slip stool. ?Keep  the floor dry. Clean up any water that spills on the floor as soon as it happens. ?Remove soap buildup in the tub or shower regularly. ?Attach bath mats securely with double-sided non-slip rug tape. ?Do not have throw rugs and other things on the floor that can make you trip. ?What can I do in the bedroom? ?Use night lights. ?Make sure that you have a light by your bed that is easy to reach. ?Do not use any sheets or blankets that are too big for your bed. They should not hang down onto the floor. ?Have a firm chair that has side arms. You can use this for support while you get dressed. ?Do not have throw rugs and other things on the floor that can make you trip. ?What can I do in the kitchen? ?Clean up any spills right away. ?Avoid walking on wet floors. ?Keep items that you use a lot in easy-to-reach places. ?If you need to reach something above you, use a strong step stool that has a grab bar. ?Keep electrical cords out of the way. ?Do not use floor polish or wax that makes floors slippery. If you must use  wax, use non-skid floor wax. ?Do not have throw rugs and other things on the floor that can make you trip. ?What can I do with my stairs? ?Do not leave any items on the stairs. ?Make sure that there are handrails on both sides of the stairs and use them. Fix handrails that are broken or loose. Make sure that handrails are as long as the stairways. ?Check any carpeting to make sure that it is firmly attached to the stairs. Fix any carpet that is loose or worn. ?Avoid having throw rugs at the top or bottom of the stairs. If you do have throw rugs, attach them to the floor with carpet tape. ?Make sure that you have a light switch at the top of the stairs and the bottom of the stairs. If you do not have them, ask someone to add them for you. ?What else can I do to help prevent falls? ?Wear shoes that: ?Do not have high heels. ?Have rubber bottoms. ?Are comfortable and fit you well. ?Are closed at the toe. Do not  wear sandals. ?If you use a stepladder: ?Make sure that it is fully opened. Do not climb a closed stepladder. ?Make sure that both sides of the stepladder are locked into place. ?Ask someone to hold it for you, if

## 2021-09-04 DIAGNOSIS — M1712 Unilateral primary osteoarthritis, left knee: Secondary | ICD-10-CM | POA: Diagnosis not present

## 2021-09-04 DIAGNOSIS — M170 Bilateral primary osteoarthritis of knee: Secondary | ICD-10-CM | POA: Diagnosis not present

## 2021-11-14 ENCOUNTER — Other Ambulatory Visit: Payer: Self-pay | Admitting: Family Medicine

## 2021-11-22 DIAGNOSIS — M170 Bilateral primary osteoarthritis of knee: Secondary | ICD-10-CM | POA: Diagnosis not present

## 2021-12-02 DIAGNOSIS — M170 Bilateral primary osteoarthritis of knee: Secondary | ICD-10-CM | POA: Diagnosis not present

## 2021-12-09 DIAGNOSIS — M170 Bilateral primary osteoarthritis of knee: Secondary | ICD-10-CM | POA: Diagnosis not present

## 2021-12-12 ENCOUNTER — Other Ambulatory Visit: Payer: Self-pay | Admitting: Family Medicine

## 2021-12-16 ENCOUNTER — Other Ambulatory Visit: Payer: Self-pay | Admitting: Family Medicine

## 2021-12-17 ENCOUNTER — Other Ambulatory Visit: Payer: Self-pay

## 2021-12-17 ENCOUNTER — Telehealth: Payer: Self-pay | Admitting: Family Medicine

## 2021-12-17 DIAGNOSIS — G4700 Insomnia, unspecified: Secondary | ICD-10-CM

## 2021-12-17 MED ORDER — TRAZODONE HCL 100 MG PO TABS: 100.0000 mg | ORAL_TABLET | Freq: Every day | ORAL | 0 refills | Status: DC

## 2021-12-17 NOTE — Telephone Encounter (Signed)
I did the refills earlier today. As for the labs, she will need to see me first so we can decide what labs we need

## 2021-12-17 NOTE — Telephone Encounter (Signed)
Requested refill sent to the pharmacy.   Patient last labs-and last office visit-09/04/20  Patient has lab appointment scheduled for 01/02/22.   Requesting lab orders to be placed.  Patient will call to schedule CPE after labs, have to coordinate work scheduled.   Please advise

## 2021-12-17 NOTE — Telephone Encounter (Signed)
Requesting refill of traZODone (DESYREL) 100 MG tablet

## 2021-12-18 NOTE — Telephone Encounter (Signed)
Left detailed message for patient to call office to schedule an CPE first then will get labs afterwards.    Previous lab appointment cancelled.

## 2021-12-31 ENCOUNTER — Ambulatory Visit (INDEPENDENT_AMBULATORY_CARE_PROVIDER_SITE_OTHER): Payer: Medicare PPO | Admitting: Family Medicine

## 2021-12-31 ENCOUNTER — Encounter: Payer: Self-pay | Admitting: Family Medicine

## 2021-12-31 VITALS — BP 110/62 | HR 72 | Ht 63.0 in | Wt 131.1 lb

## 2021-12-31 DIAGNOSIS — R739 Hyperglycemia, unspecified: Secondary | ICD-10-CM | POA: Diagnosis not present

## 2021-12-31 DIAGNOSIS — K219 Gastro-esophageal reflux disease without esophagitis: Secondary | ICD-10-CM

## 2021-12-31 DIAGNOSIS — M159 Polyosteoarthritis, unspecified: Secondary | ICD-10-CM | POA: Diagnosis not present

## 2021-12-31 DIAGNOSIS — E785 Hyperlipidemia, unspecified: Secondary | ICD-10-CM | POA: Diagnosis not present

## 2021-12-31 DIAGNOSIS — Z860100 Personal history of colon polyps, unspecified: Secondary | ICD-10-CM

## 2021-12-31 DIAGNOSIS — M150 Primary generalized (osteo)arthritis: Secondary | ICD-10-CM

## 2021-12-31 DIAGNOSIS — G4700 Insomnia, unspecified: Secondary | ICD-10-CM | POA: Diagnosis not present

## 2021-12-31 MED ORDER — ESZOPICLONE 3 MG PO TABS: 3.0000 mg | ORAL_TABLET | Freq: Every day | ORAL | 1 refills | Status: DC

## 2021-12-31 NOTE — Progress Notes (Signed)
Subjective:    Patient ID: Brooke Burke, female    DOB: [birth year], 71 y.o.   MRN: [id number]  HPI Here to follow up on issues. She feels well other than her arthritis pains. She takes daily Meloxicam 7.5 mg and adds 1000 mg pof Tylenol at bedtime. She still struggles with sleep. She has been on Trazodone for several years, but it doe snot work very well. She also takes 20 mg of melatonin OTC. She recently tried a few Lunesta tablets a friend gave her, and these worked very well. She asks for her own supply. Her GERD is well controlled. She is past due for a mammogram and a colonoscopy.    Review of Systems  Constitutional: Negative.   HENT: Negative.    Eyes: Negative.   Respiratory: Negative.    Cardiovascular: Negative.   Gastrointestinal: Negative.   Genitourinary:  Negative for decreased urine volume, difficulty urinating, dyspareunia, dysuria, enuresis, flank pain, frequency, hematuria, pelvic pain and urgency.  Musculoskeletal:  Positive for arthralgias.  Skin: Negative.   Neurological: Negative.  Negative for headaches.  Psychiatric/Behavioral:  Positive for sleep disturbance. Negative for dysphoric mood. The patient is not nervous/anxious.        Objective:   Physical Exam Constitutional:      General: She is not in acute distress.    Appearance: Normal appearance. She is well-developed.  HENT:     Head: Normocephalic and atraumatic.     Right Ear: External ear normal.     Left Ear: External ear normal.     Nose: Nose normal.     Mouth/Throat:     Pharynx: No oropharyngeal exudate.  Eyes:     General: No scleral icterus.    Conjunctiva/sclera: Conjunctivae normal.     Pupils: Pupils are equal, round, and reactive to light.  Neck:     Thyroid: No thyromegaly.     Vascular: No JVD.  Cardiovascular:     Rate and Rhythm: Normal rate and regular rhythm.     Heart sounds: Normal heart sounds. No murmur heard.    No friction rub. No gallop.  Pulmonary:     Effort:  Pulmonary effort is normal. No respiratory distress.     Breath sounds: Normal breath sounds. No wheezing or rales.  Chest:     Chest wall: No tenderness.  Abdominal:     General: Bowel sounds are normal. There is no distension.     Palpations: Abdomen is soft. There is no mass.     Tenderness: There is no abdominal tenderness. There is no guarding or rebound.  Musculoskeletal:        General: No tenderness. Normal range of motion.     Cervical back: Normal range of motion and neck supple.  Lymphadenopathy:     Cervical: No cervical adenopathy.  Skin:    General: Skin is warm and dry.     Findings: No erythema or rash.  Neurological:     Mental Status: She is alert and oriented to person, place, and time.     Cranial Nerves: No cranial nerve deficit.     Motor: No abnormal muscle tone.     Coordination: Coordination normal.     Deep Tendon Reflexes: Reflexes are normal and symmetric. Reflexes normal.  Psychiatric:        Behavior: Behavior normal.        Thought Content: Thought content normal.        Judgment: Judgment normal.  Assessment & Plan:  Her OA is stable. For her insomnia, she will stop Trazodone and take Lunesta 3 mg at bedtime. We will set up another colonoscopy. She will set up a mammogram. Get fasting labs soon for lipids, etc. We spent a total of (33   ) minutes reviewing records and discussing these issues.  Gershon Crane, MD

## 2022-01-02 ENCOUNTER — Other Ambulatory Visit (INDEPENDENT_AMBULATORY_CARE_PROVIDER_SITE_OTHER): Payer: Medicare PPO

## 2022-01-02 ENCOUNTER — Other Ambulatory Visit: Payer: Medicare PPO

## 2022-01-02 DIAGNOSIS — E785 Hyperlipidemia, unspecified: Secondary | ICD-10-CM | POA: Diagnosis not present

## 2022-01-02 DIAGNOSIS — R739 Hyperglycemia, unspecified: Secondary | ICD-10-CM | POA: Diagnosis not present

## 2022-01-02 LAB — HEPATIC FUNCTION PANEL
ALT: 38 U/L — ABNORMAL HIGH (ref 0–35)
AST: 31 U/L (ref 0–37)
Albumin: 3.9 g/dL (ref 3.5–5.2)
Alkaline Phosphatase: 79 U/L (ref 39–117)
Bilirubin, Direct: 0.1 mg/dL (ref 0.0–0.3)
Total Bilirubin: 0.3 mg/dL (ref 0.2–1.2)
Total Protein: 6.2 g/dL (ref 6.0–8.3)

## 2022-01-02 LAB — CBC WITH DIFFERENTIAL/PLATELET
Basophils Absolute: 0 10*3/uL (ref 0.0–0.1)
Basophils Relative: 0.5 % (ref 0.0–3.0)
Eosinophils Absolute: 0.1 10*3/uL (ref 0.0–0.7)
Eosinophils Relative: 2.1 % (ref 0.0–5.0)
HCT: 39.2 % (ref 36.0–46.0)
Hemoglobin: 13.1 g/dL (ref 12.0–15.0)
Lymphocytes Relative: 36.9 % (ref 12.0–46.0)
Lymphs Abs: 2 10*3/uL (ref 0.7–4.0)
MCHC: 33.5 g/dL (ref 30.0–36.0)
MCV: 94.8 fl (ref 78.0–100.0)
Monocytes Absolute: 0.2 10*3/uL (ref 0.1–1.0)
Monocytes Relative: 4.4 % (ref 3.0–12.0)
Neutro Abs: 3 10*3/uL (ref 1.4–7.7)
Neutrophils Relative %: 56.1 % (ref 43.0–77.0)
Platelets: 206 10*3/uL (ref 150.0–400.0)
RBC: 4.13 Mil/uL (ref 3.87–5.11)
RDW: 12.8 % (ref 11.5–15.5)
WBC: 5.3 10*3/uL (ref 4.0–10.5)

## 2022-01-02 LAB — BASIC METABOLIC PANEL
BUN: 17 mg/dL (ref 6–23)
CO2: 30 mEq/L (ref 19–32)
Calcium: 9.2 mg/dL (ref 8.4–10.5)
Chloride: 104 mEq/L (ref 96–112)
Creatinine, Ser: 0.93 mg/dL (ref 0.40–1.20)
GFR: 61.39 mL/min (ref >60.00)
Glucose, Bld: 79 mg/dL (ref 70–99)
Potassium: 4.4 mEq/L (ref 3.5–5.1)
Sodium: 141 mEq/L (ref 135–145)

## 2022-01-02 LAB — LIPID PANEL
Cholesterol: 216 mg/dL — ABNORMAL HIGH (ref 0–200)
HDL: 69.1 mg/dL (ref >39.00)
LDL Cholesterol: 122 mg/dL — ABNORMAL HIGH (ref 0–99)
NonHDL: 146.69
Total CHOL/HDL Ratio: 3
Triglycerides: 124 mg/dL (ref 0.0–149.0)
VLDL: 24.8 mg/dL (ref 0.0–40.0)

## 2022-01-02 LAB — TSH: TSH: 2.05 u[IU]/mL (ref 0.35–5.50)

## 2022-01-02 LAB — HEMOGLOBIN A1C: Hgb A1c MFr Bld: 5.2 % (ref 4.6–6.5)

## 2022-03-14 DIAGNOSIS — M170 Bilateral primary osteoarthritis of knee: Secondary | ICD-10-CM | POA: Diagnosis not present

## 2022-03-17 ENCOUNTER — Other Ambulatory Visit: Payer: Self-pay | Admitting: Family Medicine

## 2022-03-17 DIAGNOSIS — G4700 Insomnia, unspecified: Secondary | ICD-10-CM

## 2022-06-12 DIAGNOSIS — M170 Bilateral primary osteoarthritis of knee: Secondary | ICD-10-CM | POA: Diagnosis not present

## 2022-06-19 DIAGNOSIS — M170 Bilateral primary osteoarthritis of knee: Secondary | ICD-10-CM | POA: Diagnosis not present

## 2022-06-25 ENCOUNTER — Telehealth: Payer: Self-pay | Admitting: Family Medicine

## 2022-06-25 NOTE — Telephone Encounter (Signed)
Prescription Request  06/25/2022   LOV: 12/31/2021  What is the name of the medication or equipment? eszopiclone 3 MG TABS   Have you contacted your pharmacy to request a refill? No   Which pharmacy would you like this sent to?  [id number] [address]Phone: [phone]: [phone]   Patient notified that their request is being sent to the clinical staff for review and that they should receive a response within 2 business days.   Please advise at Mobile [phone](mobile)

## 2022-06-26 DIAGNOSIS — M170 Bilateral primary osteoarthritis of knee: Secondary | ICD-10-CM | POA: Diagnosis not present

## 2022-06-26 MED ORDER — ESZOPICLONE 3 MG PO TABS: 3.0000 mg | ORAL_TABLET | Freq: Every day | ORAL | 1 refills | Status: DC

## 2022-06-26 NOTE — Telephone Encounter (Signed)
Done

## 2022-06-26 NOTE — Telephone Encounter (Signed)
Last refill-12/31/21--90 tabs, 1 refill Last OV-12/31/21  No future OV scheduled.

## 2022-08-13 ENCOUNTER — Telehealth: Payer: Self-pay | Admitting: Family Medicine

## 2022-08-13 NOTE — Telephone Encounter (Signed)
Contacted Brooke Burke to schedule their annual wellness visit. Appointment made for 08/19/22.  Brooke Burke AWV direct phone # [phone]  Patient called and needing to r/s 4/17 appt  r/s to 4/23 phone appt

## 2022-08-19 ENCOUNTER — Telehealth (INDEPENDENT_AMBULATORY_CARE_PROVIDER_SITE_OTHER): Payer: Medicare PPO | Admitting: Family Medicine

## 2022-08-19 DIAGNOSIS — Z0000 Encounter for general adult medical examination without abnormal findings: Secondary | ICD-10-CM

## 2022-08-19 NOTE — Patient Instructions (Signed)
I really enjoyed getting to talk with you today! I am available on Tuesdays and Thursdays for virtual visits if you have any questions or concerns, or if I can be of any further assistance.   CHECKLIST FROM ANNUAL WELLNESS VISIT:  -Follow up (please call to schedule if not scheduled after visit):   -yearly for annual wellness visit with primary care office  Here is a list of your preventive care/health maintenance measures and the plan for each if any are due:  PLAN For any measures below that may be due:  -please let us know if you wish to do a mammogram and/or bone density test and we are happy to arrange for you  Health Maintenance  Topic Date Due   MAMMOGRAM  10/11/2016   INFLUENZA VACCINE  11/27/2022   Medicare Annual Wellness (AWV)  08/19/2023   COLONOSCOPY (Pts 45-17yrs Insurance coverage will need to be confirmed)  09/04/2024   DTaP/Tdap/Td (4 - Td or Tdap) 09/05/2030   Pneumonia Vaccine 16+ Years old  Completed   DEXA SCAN  Completed   COVID-19 Vaccine  Completed   Hepatitis C Screening  Completed   Zoster Vaccines- Shingrix  Completed   HPV VACCINES  Aged Out    -See a dentist at least yearly  -Get your eyes checked and then per your eye specialist's recommendations  -Other issues addressed today:   -I have included below further information regarding a healthy whole foods based diet, physical activity guidelines for adults, stress management and opportunities for social connections. I hope you find this information useful.   -----------------------------------------------------------------------------------------------------------------------------------------------------------------------------------------------------------------------------------------------------------  NUTRITION: -eat real food: lots of colorful vegetables (half the plate) and fruits -5-7 servings of vegetables and fruits per day (fresh or steamed is best), exp. 2 servings of vegetables with  lunch and dinner and 2 servings of fruit per day. Berries and greens such as kale and collards are great choices.  -consume on a regular basis: whole grains (make sure first ingredient on label contains the word "whole"), fresh fruits, fish, nuts, seeds, healthy oils (such as olive oil, avocado oil, grape seed oil) -may eat small amounts of dairy and lean meat on occasion, but avoid processed meats such as ham, bacon, lunch meat, etc. -drink water -try to avoid fast food and pre-packaged foods, processed meat -most experts advise limiting sodium to <  per day, should limit further is any chronic conditions such as high blood pressure, heart disease, diabetes, etc. The American Heart Association advised that <  is is ideal -try to avoid foods that contain any ingredients with names you do not recognize  -try to avoid sugar/sweets (except for the natural sugar that occurs in fresh fruit) -try to avoid sweet drinks -try to avoid white rice, white bread, pasta (unless whole grain), white or yellow potatoes  EXERCISE GUIDELINES FOR ADULTS: -if you wish to increase your physical activity, do so gradually and with the approval of your doctor -STOP and seek medical care immediately if you have any chest pain, chest discomfort or trouble breathing when starting or increasing exercise  -move and stretch your body, legs, feet and arms when sitting for long periods -Physical activity guidelines for optimal health in adults: -least 150 minutes per week of aerobic exercise (can talk, but not sing) once approved by your doctor, 20-30 minutes of sustained activity or two 10 minute episodes of sustained activity every day.  -resistance training at least 2 days per week if approved by your doctor -balance exercises 3+  days per week:   Stand somewhere where you have something sturdy to hold onto if you lose balance.    1) lift up on toes, start with 5x per day and work up to 20x   2) stand and lift on  leg straight out to the side so that foot is a few inches of the floor, start with 5x each side and work up to 20x each side   3) stand on one foot, start with 5 seconds each side and work up to 20 seconds on each side  If you need ideas or help with getting more active:  -Silver sneakers https://tools.silversneakers.com  -Walk with a Doc: http://www.duncan-[email or web address]/  -try to include resistance (weight lifting/strength building) and balance exercises twice per week: or the following link for ideas: http://castillo-powell.com/  BuyDucts.dk  STRESS MANAGEMENT: -can try meditating, or just sitting quietly with deep breathing while intentionally relaxing all parts of your body for 5 minutes daily -if you need further help with stress, anxiety or depression please follow up with your primary doctor or contact the wonderful folks at [facility]: [phone]  SOCIAL CONNECTIONS: -options in [place] if you wish to engage in more social and exercise related activities:  -Silver sneakers https://tools.silversneakers.com  -Walk with a Doc: http://www.duncan-[email or web address]/  -Check out the [facility] Active Adults 50+ section on the [place] [employer] (hiking clubs, book clubs, cards and games, chess, exercise classes, aquatic classes and much more) - see the website for details: https://www.[place].gov/departments/parks-recreation/active-adults50  -YouTube has lots of exercise videos for different ages and abilities as well  -[facility] (a variety of indoor and outdoor inperson activities for adults). [phone]. [address]  -Virtual Online Classes (a variety of topics): see seniorplanet.org or call [phone]  -consider volunteering at a school, hospice center, church, senior center or elsewhere         ADVANCED HEALTHCARE  DIRECTIVES:  Everyone should have advanced health care directives in place. This is so that you get the care you want, should you ever be in a situation where you are unable to make your own medical decisions.   From the  Advanced Directive Website: "Advance Health Care Directives are legal documents in which you give written instructions about your health care if, in the future, you cannot speak for yourself.   A health care power of attorney allows you to name a person you trust to make your health care decisions if you cannot make them yourself. A declaration of a desire for a natural death (or living will) is document, which states that you desire not to have your life prolonged by extraordinary measures if you have a terminal or incurable illness or if you are in a vegetative state. An advance instruction for mental health treatment makes a declaration of instructions, information and preferences regarding your mental health treatment. It also states that you are aware that the advance instruction authorizes a mental health treatment provider to act according to your wishes. It may also outline your consent or refusal of mental health treatment. A declaration of an anatomical gift allows anyone over the age of 70 to make a gift by will, organ donor card or other document."   Please see the following website or an elder law attorney for forms, FAQs and for completion of advanced directives: [place] Health Care Directives Advance Health Care Directives (http://guzman.com/)  Or copy and paste the following to your web browser: PoshChat.fi

## 2022-08-19 NOTE — Progress Notes (Signed)
PATIENT CHECK-IN and HEALTH RISK ASSESSMENT QUESTIONNAIRE:  -completed by phone/video for upcoming Medicare Preventive Visit  Pre-Visit Check-in: 1)Vitals (height, wt, BP, etc) - record in vitals section for visit on day of visit 2)Review and Update Medications, Allergies PMH, Surgeries, Social history in Epic 3)Hospitalizations in the last year with date/reason? no 4)Review and Update Care Team (patient's specialists) in Epic 5) Complete PHQ9 in Epic  6) Complete Fall Screening in Epic 7)Review all Health Maintenance Due and order under PCP if not done.  8)Medicare Wellness Questionnaire: Answer theses question about your habits: Do you drink alcohol? yes If yes, how many drinks do you have a day? 1 glass of wine per night - rarely 2 Have you ever smoked? no  How many packs a day do/did you smoke? no Do you use smokeless tobacco?no Do you use an illicit drugs?no Do you exercises? Some IF so, what type and how many days/minutes per week?walks about 20 minutes  Are you sexually active?  no Typical breakfast: toast with peanut butter, apples Typical lunch: skips or snacks Typical dinner: trying to do more chicken and fish, salads Typical snacks:fresh fruits, low fat cottage cheese  Beverages: water  Answer theses question about you: Can you perform most household chores? yes Do you find it hard to follow a conversation in a noisy room?no Do you often ask people to speak up or repeat themselves?no Do you feel that you have a problem with memory? no Do you balance your checkbook and or bank acounts?yes Do you feel safe at home?yes Last dentist visit? Goes at least twice yearly Do you need assistance with any of the following: Please note if so  - none  Driving?  Feeding yourself?  Getting from bed to chair?  Getting to the toilet?  Bathing or showering?  Dressing yourself?  Managing money?  Climbing a flight of stairs  Preparing meals?  Do you have Advanced Directives in  place (Living Will, Healthcare Power or Attorney)? Working on it   Last eye Exam and location? Has been a few years   Do you currently use prescribed or non-prescribed narcotic or opioid pain medications?no  Do you have a history or close family history of breast, ovarian, tubal or peritoneal cancer or a family member with BRCA (breast cancer susceptibility 1 and 2) gene mutations?  Nurse/Assistant Credentials/time stamp:   ----------------------------------------------------------------------------------------------------------------------------------------------------------------------------------------------------------------------   MEDICARE ANNUAL PREVENTIVE VISIT WITH PROVIDER: (Welcome to [employer], initial annual wellness or annual wellness exam)  Virtual Visit via Phone Note  I connected with Gillis Santa on 08/19/22 by phone and verified that I am speaking with the correct person using two identifiers.  Location patient: home Location provider:work or home office Persons participating in the virtual visit: patient, provider  Concerns and/or follow up today: reports is doing well. Sees specialist for knee arthritis.    See HM section in Epic for other details of completed HM.    ROS: negative for report of fevers, unintentional weight loss, vision changes, vision loss, hearing loss or change, chest pain, sob, hemoptysis, melena, hematochezia, hematuria, falls, bleeding or bruising, thoughts of suicide or self harm, memory loss  Patient-completed extensive health risk assessment - reviewed and discussed with the patient: See Health Risk Assessment completed with patient prior to the visit either above or in recent phone note. This was reviewed in detailed with the patient today and appropriate recommendations, orders and referrals were placed as needed per Summary below and patient instructions.   Review of  Medical History: -PMH, PSH, Family History and current  specialty and care providers reviewed and updated and listed below   Patient Care Team: Nelwyn Salisbury, MD as PCP - General   Past Medical History:  Diagnosis Date   Arthritis    Asthma    GERD (gastroesophageal reflux disease)    Headache(784.0)    Insomnia     Past Surgical History:  Procedure Laterality Date   COLONOSCOPY  09/05/2014   per Dr. Kinnie Scales, precancerous polyp, repeat in 5 yrs    KNEE ARTHROSCOPY Bilateral    TONSILLECTOMY      Social History   Socioeconomic History   Marital status: Single    Spouse name: Not on file   Number of children: Not on file   Years of education: Not on file   Highest education level: Not on file  Occupational History   Not on file  Tobacco Use   Smoking status: Never   Smokeless tobacco: Never  Substance and Sexual Activity   Alcohol use: Yes    Alcohol/week: 0.0 standard drinks of alcohol    Comment: occ   Drug use: No   Sexual activity: Not on file  Other Topics Concern   Not on file  Social History Narrative   Not on file   Social Determinants of Health   Financial Resource Strain: Low Risk  (08/09/2021)   Overall Financial Resource Strain (CARDIA)    Difficulty of Paying Living Expenses: Not hard at all  Food Insecurity: No Food Insecurity (08/09/2021)   Hunger Vital Sign    Worried About Running Out of Food in the Last Year: Never true    Ran Out of Food in the Last Year: Never true  Transportation Needs: No Transportation Needs (08/09/2021)   PRAPARE - Administrator, Civil Service (Medical): No    Lack of Transportation (Non-Medical): No  Physical Activity: Sufficiently Active (08/09/2021)   Exercise Vital Sign    Days of Exercise per Week: 5 days    Minutes of Exercise per Session: 30 min  Stress: No Stress Concern Present (08/09/2021)   [employer] of Occupational Health - Occupational Stress Questionnaire    Feeling of Stress : Not at all  Social Connections: Moderately Integrated  (08/09/2021)   Social Connection and Isolation Panel [NHANES]    Frequency of Communication with Friends and Family: More than three times a week    Frequency of Social Gatherings with Friends and Family: More than three times a week    Attends Religious Services: More than 4 times per year    Active Member of [employer] or Organizations: Yes    Attends Engineer, structural: More than 4 times per year    Marital Status: Divorced  Intimate Partner Violence: Not At Risk (08/09/2021)   Humiliation, Afraid, Rape, and Kick questionnaire    Fear of Current or Ex-Partner: No    Emotionally Abused: No    Physically Abused: No    Sexually Abused: No    Family History  Problem Relation Age of Onset   Diabetes Other    Hyperlipidemia Other    Hypertension Other     Current Outpatient Medications on File Prior to Visit  Medication Sig Dispense Refill   eszopiclone 3 MG TABS Take 1 tablet (3 mg total) by mouth at bedtime. Take immediately before bedtime 90 tablet 1   meloxicam (MOBIC) 7.5 MG tablet Take 7.5 mg by mouth daily.     omeprazole (PRILOSEC)  20 MG capsule Take 1 capsule (20 mg total) by mouth once for 1 dose. (Patient taking differently: Take 20 mg by mouth once. OTC) 1 capsule 0   No current facility-administered medications on file prior to visit.    No Known Allergies     Physical Exam There were no vitals filed for this visit. Estimated body mass index is 23.23 kg/m as calculated from the following:   Height as of 12/31/21:  (1.6 m).   Weight as of 12/31/21: 131 lb 2 oz (59.5 kg).  EKG (optional): deferred due to virtual visit  GENERAL: alert, oriented, no acute distress detected, full vision exam deferred due to pandemic and/or virtual encounter  PSYCH/NEURO: pleasant and cooperative, no obvious depression or anxiety, speech and thought processing grossly intact, Cognitive function grossly intact  Flowsheet Row Office Visit from 12/31/2021 in [facility]  PHQ-9 Total Score 1           12/31/2021    4:40 PM 08/09/2021    3:54 PM 09/04/2020    8:18 AM 08/08/2020    2:21 PM 10/26/2017    3:46 PM  Depression screen PHQ 2/9  Decreased Interest 0 0 0 0 0  Down, Depressed, Hopeless 0 0 0 0 0  PHQ - 2 Score 0 0 0 0 0  Altered sleeping 1  0    Tired, decreased energy 0  1    Change in appetite 0  0    Feeling bad or failure about yourself  0  1    Trouble concentrating 0  0    Moving slowly or fidgety/restless 0  0    Suicidal thoughts 0  0    PHQ-9 Score 1  2    Difficult doing work/chores Not difficult at all  Not difficult at all         08/08/2020    2:28 PM 09/04/2020    8:01 AM 08/09/2021    3:58 PM 12/31/2021    4:40 PM 08/19/2022    5:32 PM  Fall Risk  Falls in the past year? 0 0 0 0 0  Was there an injury with Fall? 0 0 0 0 0  Fall Risk Category Calculator 0 0 0 0 0  Fall Risk Category (Retired) Low Low Low Low   (RETIRED) Patient Fall Risk Level Low fall risk Low fall risk Low fall risk Low fall risk   Patient at Risk for Falls Due to  No Fall Risks No Fall Risks No Fall Risks No Fall Risks  Fall risk Follow up Falls evaluation completed   Falls evaluation completed Falls prevention discussed     SUMMARY AND PLAN:  Encounter for Medicare annual wellness exam   Discussed applicable health maintenance/preventive health measures and advised and referred or ordered per patient preferences: -she declines mammogram, advised if not doing mammo to do monthly breast self breast exam and regular cbe. Advised to notify doctor immediately if any lumps, bumps, skin changes, breast changes. Advised to let PCP know if wishes to do mammogram at any point. -discussed bone density due in May and advised to check with PCP then if wishes to do Health Maintenance  Topic Date Due   MAMMOGRAM  10/11/2016   INFLUENZA VACCINE  11/27/2022   Medicare Annual Wellness (AWV)  08/19/2023   COLONOSCOPY (Pts 45-103yrs Insurance  coverage will need to be confirmed)  09/04/2024   DTaP/Tdap/Td (4 - Td or Tdap) 09/05/2030  Pneumonia Vaccine 70+ Years old  Completed   DEXA SCAN  Completed   COVID-19 Vaccine  Completed   Hepatitis C Screening  Completed   Zoster Vaccines- Shingrix  Completed   HPV VACCINES  Aged Out   Education and counseling on the following was provided based on the above review of health and a plan/checklist for the patient, along with additional information discussed, was provided for the patient in the patient instructions :  -Advised on importance of completing advanced directives, discussed options for completing and provided information in patient instructions as well -Provided  safe balance exercises that can be done at home to improve balance and discussed exercise guidelines for adults with include balance exercises at least 3 days per week.  -Advised and counseled on a healthy lifestyle - including the importance of a healthy diet, regular physical activity, social connections and stress management. -Reviewed patient's current diet. Advised and counseled on a whole foods based healthy diet. A summary of a healthy diet was provided in the Patient Instructions.  -reviewed patient's current physical activity level and discussed exercise guidelines for adults. Discussed community resources and ideas for safe exercise at home to assist in meeting exercise guideline recommendations in a safe and healthy way.  -Advise yearly dental visits at minimum and regular eye exams -Advised and counseled on alcohol safe limits, risks - advise no more than 1 x 5oz pour of alcohol per 24 hour period Follow up: see patient instructions     Patient Instructions  I really enjoyed getting to talk with you today! I am available on Tuesdays and Thursdays for virtual visits if you have any questions or concerns, or if I can be of any further assistance.   CHECKLIST FROM ANNUAL WELLNESS VISIT:  -Follow up (please call  to schedule if not scheduled after visit):   -yearly for annual wellness visit with primary care office  Here is a list of your preventive care/health maintenance measures and the plan for each if any are due:  PLAN For any measures below that may be due:  -please let us know if you wish to do a mammogram and/or bone density test and we are happy to arrange for you  Health Maintenance  Topic Date Due   MAMMOGRAM  10/11/2016   INFLUENZA VACCINE  11/27/2022   Medicare Annual Wellness (AWV)  08/19/2023   COLONOSCOPY (Pts 45-38yrs Insurance coverage will need to be confirmed)  09/04/2024   DTaP/Tdap/Td (4 - Td or Tdap) 09/05/2030   Pneumonia Vaccine 49+ Years old  Completed   DEXA SCAN  Completed   COVID-19 Vaccine  Completed   Hepatitis C Screening  Completed   Zoster Vaccines- Shingrix  Completed   HPV VACCINES  Aged Out    -See a dentist at least yearly  -Get your eyes checked and then per your eye specialist's recommendations  -Other issues addressed today:   -I have included below further information regarding a healthy whole foods based diet, physical activity guidelines for adults, stress management and opportunities for social connections. I hope you find this information useful.   -----------------------------------------------------------------------------------------------------------------------------------------------------------------------------------------------------------------------------------------------------------  NUTRITION: -eat real food: lots of colorful vegetables (half the plate) and fruits -5-7 servings of vegetables and fruits per day (fresh or steamed is best), exp. 2 servings of vegetables with lunch and dinner and 2 servings of fruit per day. Berries and greens such as kale and collards are great choices.  -consume on a regular basis: whole grains (make sure first ingredient  on label contains the word "whole"), fresh fruits, fish, nuts, seeds,  healthy oils (such as olive oil, avocado oil, grape seed oil) -may eat small amounts of dairy and lean meat on occasion, but avoid processed meats such as ham, bacon, lunch meat, etc. -drink water -try to avoid fast food and pre-packaged foods, processed meat -most experts advise limiting sodium to < 2300mg  per day, should limit further is any chronic conditions such as high blood pressure, heart disease, diabetes, etc. The American Heart Association advised that < 1500mg  is is ideal -try to avoid foods that contain any ingredients with names you do not recognize  -try to avoid sugar/sweets (except for the natural sugar that occurs in fresh fruit) -try to avoid sweet drinks -try to avoid white rice, white bread, pasta (unless whole grain), white or yellow potatoes  EXERCISE GUIDELINES FOR ADULTS: -if you wish to increase your physical activity, do so gradually and with the approval of your doctor -STOP and seek medical care immediately if you have any chest pain, chest discomfort or trouble breathing when starting or increasing exercise  -move and stretch your body, legs, feet and arms when sitting for long periods -Physical activity guidelines for optimal health in adults: -least 150 minutes per week of aerobic exercise (can talk, but not sing) once approved by your doctor, 20-30 minutes of sustained activity or two 10 minute episodes of sustained activity every day.  -resistance training at least 2 days per week if approved by your doctor -balance exercises 3+ days per week:   Stand somewhere where you have something sturdy to hold onto if you lose balance.    1) lift up on toes, start with 5x per day and work up to 20x   2) stand and lift on leg straight out to the side so that foot is a few inches of the floor, start with 5x each side and work up to 20x each side   3) stand on one foot, start with 5 seconds each side and work up to 20 seconds on each side  If you need ideas or help with  getting more active:  -Silver sneakers https://tools.silversneakers.com  -Walk with a Doc: http://www.duncan-williams.com/  -try to include resistance (weight lifting/strength building) and balance exercises twice per week: or the following link for ideas: http://castillo-powell.com/  BuyDucts.dk  STRESS MANAGEMENT: -can try meditating, or just sitting quietly with deep breathing while intentionally relaxing all parts of your body for 5 minutes daily -if you need further help with stress, anxiety or depression please follow up with your primary doctor or contact the wonderful folks at [facility]: [phone]  SOCIAL CONNECTIONS: -options in [place] if you wish to engage in more social and exercise related activities:  -Silver sneakers https://tools.silversneakers.com  -Walk with a Doc: http://www.duncan-williams.com/  -Check out the [facility] Active Adults 50+ section on the [place] of [employer] (hiking clubs, book clubs, cards and games, chess, exercise classes, aquatic classes and much more) - see the website for details: https://www.[place].gov/departments/parks-recreation/active-adults50  -YouTube has lots of exercise videos for different ages and abilities as well  -[facility] (a variety of indoor and outdoor inperson activities for adults). [phone]. [address].  -Virtual Online Classes (a variety of topics): see seniorplanet.org or call [phone]  -consider volunteering at a school, hospice center, church, senior center or elsewhere         ADVANCED HEALTHCARE DIRECTIVES:  Everyone should have advanced health care directives in place. This is so  that you get the care you want, should you ever be in a situation where you are unable to make your own medical decisions.   From the [place] Advanced Directive Website: "Advance Health  Care Directives are legal documents in which you give written instructions about your health care if, in the future, you cannot speak for yourself.   A health care power of attorney allows you to name a person you trust to make your health care decisions if you cannot make them yourself. A declaration of a desire for a natural death (or living will) is document, which states that you desire not to have your life prolonged by extraordinary measures if you have a terminal or incurable illness or if you are in a vegetative state. An advance instruction for mental health treatment makes a declaration of instructions, information and preferences regarding your mental health treatment. It also states that you are aware that the advance instruction authorizes a mental health treatment provider to act according to your wishes. It may also outline your consent or refusal of mental health treatment. A declaration of an anatomical gift allows anyone over the age of 23 to make a gift by will, organ donor card or other document."   Please see the following website or an elder law attorney for forms, FAQs and for completion of advanced directives: [place] TEFL teacher Health Care Directives Advance Health Care Directives (http://guzman.com/)  Or copy and paste the following to your web browser: PoshChat.fi    Terressa Koyanagi, DO

## 2022-09-24 ENCOUNTER — Other Ambulatory Visit: Payer: Self-pay | Admitting: Family Medicine

## 2022-09-24 DIAGNOSIS — G4700 Insomnia, unspecified: Secondary | ICD-10-CM

## 2022-12-12 ENCOUNTER — Encounter: Payer: Self-pay | Admitting: Family Medicine

## 2022-12-12 DIAGNOSIS — Z0000 Encounter for general adult medical examination without abnormal findings: Secondary | ICD-10-CM

## 2022-12-15 ENCOUNTER — Encounter: Payer: Self-pay | Admitting: Family Medicine

## 2022-12-15 ENCOUNTER — Ambulatory Visit: Payer: Medicare PPO | Admitting: Family Medicine

## 2022-12-15 VITALS — BP 140/80 | HR 70 | Temp 98.3°F | Wt 118.6 lb

## 2022-12-15 DIAGNOSIS — F418 Other specified anxiety disorders: Secondary | ICD-10-CM

## 2022-12-15 DIAGNOSIS — M940 Chondrocostal junction syndrome [Tietze]: Secondary | ICD-10-CM

## 2022-12-15 DIAGNOSIS — R634 Abnormal weight loss: Secondary | ICD-10-CM

## 2022-12-15 DIAGNOSIS — N644 Mastodynia: Secondary | ICD-10-CM | POA: Diagnosis not present

## 2022-12-15 MED ORDER — SERTRALINE HCL 50 MG PO TABS: 50.0000 mg | ORAL_TABLET | Freq: Every day | ORAL | 3 refills | Status: DC

## 2022-12-15 MED ORDER — ESZOPICLONE 3 MG PO TABS: 3.0000 mg | ORAL_TABLET | Freq: Every day | ORAL | 1 refills | Status: DC

## 2022-12-15 NOTE — Progress Notes (Signed)
   Subjective:    Patient ID: Brooke Burke, female    DOB: 1950/12/07, 72 y.o.   MRN: [id number]  HPI Here for several issues. First she has had a sharp pain in the left breast for the past 3 weeks. No nipple DC. This reminds her of the breast pain she saw [place] for in 2017. She had a diagnostic mammogram and an [place] then that were normal. The pain went away and now has come back. Alsi she is concerned about unintentional weight loss. She has lost 17 lbs in the past year. She thinks she eats as much as she used to, but she is not sure. She admits to a lot of anxiety and some depression this past year. She lost her husband a few years ago, and then she lost her dog a few months ago. She worries about things and she often feels sad and tearful. She sleeps well by using Lunesta.    Review of Systems  Constitutional:  Positive for appetite change and unexpected weight change.  HENT: Negative.    Respiratory: Negative.    Cardiovascular:  Positive for chest pain. Negative for palpitations and leg swelling.  Gastrointestinal: Negative.   Genitourinary: Negative.   Neurological: Negative.   Psychiatric/Behavioral:  Positive for decreased concentration and dysphoric mood. Negative for agitation, behavioral problems, confusion, hallucinations, self-injury, sleep disturbance and suicidal ideas. The patient is nervous/anxious.        Objective:   Physical Exam Constitutional:      Appearance: Normal appearance.  Cardiovascular:     Rate and Rhythm: Normal rate and regular rhythm.     Pulses: Normal pulses.     Heart sounds: Normal heart sounds.  Pulmonary:     Effort: Pulmonary effort is normal.     Breath sounds: Normal breath sounds.     Comments: She is quite tender along the left sternal margin. The left breast is mildly tender along the medial half, but no lumps are felt. No nipple DC. The right breast is clear. Both axillae are clear.  Neurological:     General: No focal deficit present.      Mental Status: She is alert and oriented to person, place, and time.  Psychiatric:        Behavior: Behavior normal.        Thought Content: Thought content normal.     Comments: Affect is mildly depressed            Assessment & Plan:  Her left chest pain seems to be from costochondritis primarily. We will set up a diagnostic mammogram and US of the left breast to female sure the breast is clear. She has a Prednisone 5 mg taper pack at home from Dr. Shelle Iron, her orthopedist, that she never took. I advised her to take this now for the inflammation. For the unintended weight loss, we will get labs today. I suspect the main contributor is her depression, and we agreed to treat this with Zoloft 50 mg daily. We will follow up in 3-4 weeks.  Gershon Crane, MD

## 2022-12-16 LAB — CBC WITH DIFFERENTIAL/PLATELET
Basophils Absolute: 0.1 10*3/uL (ref 0.0–0.1)
Basophils Relative: 1 % (ref 0.0–3.0)
Eosinophils Absolute: 0.1 10*3/uL (ref 0.0–0.7)
Eosinophils Relative: 0.8 % (ref 0.0–5.0)
HCT: 38.5 % (ref 36.0–46.0)
Hemoglobin: 12.7 g/dL (ref 12.0–15.0)
Lymphocytes Relative: 25.5 % (ref 12.0–46.0)
Lymphs Abs: 1.9 10*3/uL (ref 0.7–4.0)
MCHC: 33 g/dL (ref 30.0–36.0)
MCV: 94.9 fl (ref 78.0–100.0)
Monocytes Absolute: 0.3 10*3/uL (ref 0.1–1.0)
Monocytes Relative: 4.4 % (ref 3.0–12.0)
Neutro Abs: 5.1 10*3/uL (ref 1.4–7.7)
Neutrophils Relative %: 68.3 % (ref 43.0–77.0)
Platelets: 225 10*3/uL (ref 150.0–400.0)
RBC: 4.05 Mil/uL (ref 3.87–5.11)
RDW: 13.7 % (ref 11.5–15.5)
WBC: 7.5 10*3/uL (ref 4.0–10.5)

## 2022-12-16 LAB — BASIC METABOLIC PANEL
BUN: 25 mg/dL — ABNORMAL HIGH (ref 6–23)
CO2: 28 meq/L (ref 19–32)
Calcium: 9.4 mg/dL (ref 8.4–10.5)
Chloride: 103 meq/L (ref 96–112)
Creatinine, Ser: 0.87 mg/dL (ref 0.40–1.20)
GFR: 66.07 mL/min (ref >60.00)
Glucose, Bld: 86 mg/dL (ref 70–99)
Potassium: 4.7 meq/L (ref 3.5–5.1)
Sodium: 140 mEq/L (ref 135–145)

## 2022-12-16 LAB — HEPATIC FUNCTION PANEL
ALT: 35 U/L (ref 0–35)
AST: 27 U/L (ref 0–37)
Albumin: 4.3 g/dL (ref 3.5–5.2)
Alkaline Phosphatase: 83 U/L (ref 39–117)
Bilirubin, Direct: 0.1 mg/dL (ref 0.0–0.3)
Total Bilirubin: 0.4 mg/dL (ref 0.2–1.2)
Total Protein: 6.6 g/dL (ref 6.0–8.3)

## 2022-12-16 LAB — TSH: TSH: 1.4 u[IU]/mL (ref 0.35–5.50)

## 2022-12-16 LAB — HEMOGLOBIN A1C: Hgb A1c MFr Bld: 5 % (ref 4.6–6.5)

## 2022-12-17 ENCOUNTER — Other Ambulatory Visit: Payer: Self-pay | Admitting: Family Medicine

## 2022-12-17 DIAGNOSIS — G4700 Insomnia, unspecified: Secondary | ICD-10-CM

## 2022-12-24 DIAGNOSIS — M170 Bilateral primary osteoarthritis of knee: Secondary | ICD-10-CM | POA: Diagnosis not present

## 2022-12-31 DIAGNOSIS — M170 Bilateral primary osteoarthritis of knee: Secondary | ICD-10-CM | POA: Diagnosis not present

## 2023-01-05 DIAGNOSIS — R0981 Nasal congestion: Secondary | ICD-10-CM | POA: Diagnosis not present

## 2023-01-05 DIAGNOSIS — Z6822 Body mass index (BMI) 22.0-22.9, adult: Secondary | ICD-10-CM | POA: Diagnosis not present

## 2023-01-05 DIAGNOSIS — R051 Acute cough: Secondary | ICD-10-CM | POA: Diagnosis not present

## 2023-01-05 DIAGNOSIS — J069 Acute upper respiratory infection, unspecified: Secondary | ICD-10-CM | POA: Diagnosis not present

## 2023-01-06 ENCOUNTER — Other Ambulatory Visit: Payer: Self-pay | Admitting: Family Medicine

## 2023-01-07 DIAGNOSIS — M170 Bilateral primary osteoarthritis of knee: Secondary | ICD-10-CM | POA: Diagnosis not present

## 2023-01-12 DIAGNOSIS — L812 Freckles: Secondary | ICD-10-CM | POA: Diagnosis not present

## 2023-01-12 DIAGNOSIS — D225 Melanocytic nevi of trunk: Secondary | ICD-10-CM | POA: Diagnosis not present

## 2023-01-12 DIAGNOSIS — L723 Sebaceous cyst: Secondary | ICD-10-CM | POA: Diagnosis not present

## 2023-01-12 DIAGNOSIS — Z85828 Personal history of other malignant neoplasm of skin: Secondary | ICD-10-CM | POA: Diagnosis not present

## 2023-01-12 DIAGNOSIS — L821 Other seborrheic keratosis: Secondary | ICD-10-CM | POA: Diagnosis not present

## 2023-01-12 DIAGNOSIS — D2371 Other benign neoplasm of skin of right lower limb, including hip: Secondary | ICD-10-CM | POA: Diagnosis not present

## 2023-01-12 DIAGNOSIS — D2271 Melanocytic nevi of right lower limb, including hip: Secondary | ICD-10-CM | POA: Diagnosis not present

## 2023-01-12 DIAGNOSIS — D692 Other nonthrombocytopenic purpura: Secondary | ICD-10-CM | POA: Diagnosis not present

## 2023-01-19 ENCOUNTER — Encounter: Payer: Self-pay | Admitting: Family Medicine

## 2023-01-19 ENCOUNTER — Ambulatory Visit: Payer: Medicare PPO | Admitting: Family Medicine

## 2023-01-19 VITALS — BP 136/70 | HR 82 | Temp 98.2°F | Wt 116.0 lb

## 2023-01-19 DIAGNOSIS — K580 Irritable bowel syndrome with diarrhea: Secondary | ICD-10-CM | POA: Diagnosis not present

## 2023-01-19 DIAGNOSIS — R634 Abnormal weight loss: Secondary | ICD-10-CM

## 2023-01-19 DIAGNOSIS — F418 Other specified anxiety disorders: Secondary | ICD-10-CM | POA: Diagnosis not present

## 2023-01-19 DIAGNOSIS — K589 Irritable bowel syndrome without diarrhea: Secondary | ICD-10-CM | POA: Insufficient documentation

## 2023-01-19 DIAGNOSIS — R630 Anorexia: Secondary | ICD-10-CM | POA: Diagnosis not present

## 2023-01-19 MED ORDER — DIPHENOXYLATE-ATROPINE 2.5-0.025 MG PO TABS: 1.0000 | ORAL_TABLET | Freq: Two times a day (BID) | ORAL | 0 refills | Status: DC | PRN

## 2023-01-19 MED ORDER — MIRTAZAPINE 15 MG PO TBDP: 15.0000 mg | ORAL_TABLET | Freq: Every day | ORAL | 2 refills | Status: DC

## 2023-01-19 NOTE — Progress Notes (Signed)
Subjective:    Patient ID: Brooke Burke, female    DOB: 01-Oct-1950, 72 y.o.   MRN: [id number]  HPI Here to follow up on depression with anxiety and weight loss. At our last visit we started her on Zoloft 50 mg every morning, and she says this has helped her moods a little. She is less depressed, but she still has some rough days. She sleeps well. Her appetite is still down, however and I see she has lost another 2 lbs since her last visit. She thinks some of this weight loss is due to diarrhea. For the past few months she has frequent bloating of her abdomen, particularly after she eats a meal. She often has diarrhea as well. No abdominal pain or fever or nausea. She has been using a lot of Pepto-Bismol for this. She has tried to eliminate some of the foods that seem to upset her stomach like spicy or fatty foods. She drinks a cup of regular coffee every morning and a glass of wine every night.    Review of Systems  Constitutional:  Positive for appetite change and unexpected weight change.  Respiratory: Negative.    Cardiovascular: Negative.   Gastrointestinal:  Positive for diarrhea. Negative for abdominal distention, abdominal pain, blood in stool, constipation, nausea and vomiting.  Psychiatric/Behavioral:  Positive for dysphoric mood. Negative for sleep disturbance. The patient is nervous/anxious.        Objective:   Physical Exam Constitutional:      Appearance: Normal appearance.     Comments: She look better than she did at the last visit  Cardiovascular:     Rate and Rhythm: Normal rate and regular rhythm.     Pulses: Normal pulses.     Heart sounds: Normal heart sounds.  Pulmonary:     Effort: Pulmonary effort is normal.     Breath sounds: Normal breath sounds.  Abdominal:     General: Abdomen is flat. Bowel sounds are normal. There is no distension.     Palpations: Abdomen is soft. There is no mass.     Tenderness: There is no abdominal tenderness. There is no guarding  or rebound.  Neurological:     Mental Status: She is alert.  Psychiatric:        Mood and Affect: Mood normal.        Behavior: Behavior normal.        Thought Content: Thought content normal.           Assessment & Plan:  Her depression and anxiety are improved, and we agreed to stay on the Zoloft at the current dosage. We will add Mirtazapine 15 mg at bedtime to help the depression and also to boost appetite. She can use Lomotil as needed for the IBS symptoms. I suggested she cut back on use of caffeine and alcohol. Recheck in one month.  Gershon Crane, MD

## 2023-01-21 DIAGNOSIS — H5213 Myopia, bilateral: Secondary | ICD-10-CM | POA: Diagnosis not present

## 2023-01-27 DIAGNOSIS — Z2989 Encounter for other specified prophylactic measures: Secondary | ICD-10-CM | POA: Diagnosis not present

## 2023-01-27 DIAGNOSIS — Z789 Other specified health status: Secondary | ICD-10-CM | POA: Diagnosis not present

## 2023-01-27 DIAGNOSIS — Z23 Encounter for immunization: Secondary | ICD-10-CM | POA: Diagnosis not present

## 2023-02-11 ENCOUNTER — Other Ambulatory Visit: Payer: Self-pay | Admitting: Family Medicine

## 2023-02-18 ENCOUNTER — Ambulatory Visit: Payer: Medicare PPO | Admitting: Family Medicine

## 2023-02-18 ENCOUNTER — Encounter: Payer: Self-pay | Admitting: Family Medicine

## 2023-02-18 VITALS — BP 118/70 | HR 61 | Temp 98.3°F | Wt 123.0 lb

## 2023-02-18 DIAGNOSIS — R61 Generalized hyperhidrosis: Secondary | ICD-10-CM

## 2023-02-18 DIAGNOSIS — G4700 Insomnia, unspecified: Secondary | ICD-10-CM | POA: Diagnosis not present

## 2023-02-18 DIAGNOSIS — F418 Other specified anxiety disorders: Secondary | ICD-10-CM

## 2023-02-18 DIAGNOSIS — R634 Abnormal weight loss: Secondary | ICD-10-CM

## 2023-02-18 DIAGNOSIS — K580 Irritable bowel syndrome with diarrhea: Secondary | ICD-10-CM | POA: Diagnosis not present

## 2023-02-18 MED ORDER — DIPHENOXYLATE-ATROPINE 2.5-0.025 MG PO TABS: 1.0000 | ORAL_TABLET | Freq: Two times a day (BID) | ORAL | 5 refills | Status: AC | PRN

## 2023-02-18 MED ORDER — SERTRALINE HCL 25 MG PO TABS: 25.0000 mg | ORAL_TABLET | Freq: Every day | ORAL | 3 refills | Status: DC

## 2023-02-18 NOTE — Progress Notes (Signed)
   Subjective:    Patient ID: Brooke Burke, female    DOB: 02/25/1951, 72 y.o.   MRN: [id number]  HPI Here to follow up on depression, weight loss, and diarrhea. At our last visit we added Mirtazepine at bedtime to her Zoloft, and this immediately boosted her appetite. She has gained 7 lbs since our last visit. She was also given Lomotil to use as needed for diarrhea, and this has wokred very well for her. She says she now has more confidence to go out in public and she does not worry about having accidents. She actually stopped taking the Mirtazepine after 2 weeks because she felt she no longer needed it. Her moods have been much improved. She does mention a new problem however that started about 2 weeks ago, and this is night sweats. She has been waking up in the middle of the night with wet sheets. She has not had this problem since she went through menopause years ago. She does not feel ill. Of note she has been taking the Zoloft for about 8 weeks. She had labs drawn on 12-14-21 including a TSH that were normal.    Review of Systems  Constitutional:  Positive for diaphoresis. Negative for fever.  HENT: Negative.    Respiratory: Negative.    Cardiovascular: Negative.   Gastrointestinal:  Positive for diarrhea. Negative for abdominal distention, abdominal pain, nausea and vomiting.  Genitourinary: Negative.        Objective:   Physical Exam Constitutional:      Appearance: Normal appearance. She is not ill-appearing.  Cardiovascular:     Rate and Rhythm: Normal rate and regular rhythm.     Pulses: Normal pulses.     Heart sounds: Normal heart sounds.  Pulmonary:     Effort: Pulmonary effort is normal.     Breath sounds: Normal breath sounds.  Neurological:     General: No focal deficit present.     Mental Status: She is alert and oriented to person, place, and time.  Psychiatric:        Mood and Affect: Mood normal.        Behavior: Behavior normal.        Thought Content: Thought  content normal.           Assessment & Plan:  Her depression and anxiety are much improved by taking Zoloft, but I think her night sweats may be a serotonergic side effect of this when her blood levels drop during the night. We agreed to decrease the dose of this to 25 mg every morning. She will use the Lomotil as needed. She will report back in 2 weeks.  Gershon Crane, MD

## 2023-03-14 ENCOUNTER — Other Ambulatory Visit: Payer: Self-pay | Admitting: Family Medicine

## 2023-03-23 DIAGNOSIS — M170 Bilateral primary osteoarthritis of knee: Secondary | ICD-10-CM | POA: Diagnosis not present

## 2023-04-07 ENCOUNTER — Other Ambulatory Visit: Payer: Self-pay | Admitting: Family Medicine

## 2023-06-03 DIAGNOSIS — M1711 Unilateral primary osteoarthritis, right knee: Secondary | ICD-10-CM | POA: Diagnosis not present

## 2023-06-03 DIAGNOSIS — M1712 Unilateral primary osteoarthritis, left knee: Secondary | ICD-10-CM | POA: Diagnosis not present

## 2023-06-20 ENCOUNTER — Other Ambulatory Visit: Payer: Self-pay | Admitting: Family Medicine

## 2023-06-22 ENCOUNTER — Other Ambulatory Visit: Payer: Self-pay | Admitting: Family Medicine

## 2023-06-22 NOTE — Telephone Encounter (Signed)
 Last Fill: 12/15/22  Last OV: 02/18/23 Next OV: None Scheduled  Routing to provider for review/authorization.

## 2023-06-22 NOTE — Telephone Encounter (Signed)
 Copied from CRM [id number]. Topic: Clinical - Medication Refill >> Jun 22, 2023 10:47 AM Isabell A wrote: Most Recent Primary Care Visit:  Provider: Gershon Crane A  Department: [facility]  Visit Type: OFFICE VISIT  Date: 02/18/2023  Medication: eszopiclone 3 MG TABS  Has the patient contacted their pharmacy? Yes (Agent: If no, request that the patient contact the pharmacy for the refill. If patient does not wish to contact the pharmacy document the reason why and proceed with request.) (Agent: If yes, when and what did the pharmacy advise?)  Is this the correct pharmacy for this prescription? Yes If no, delete pharmacy and type the correct one.  This is the patient's preferred pharmacy:  [id number] [place], [place] - [address] [place] [address] Phone: [phone] Fax: [phone]   Has the prescription been filled recently? Yes  Is the patient out of the medication? No, 2 left   Has the patient been seen for an appointment in the last year OR does the patient have an upcoming appointment? Yes  Can we respond through MyChart? No  Agent: Please be advised that Rx refills may take up to 3 business days. We ask that you follow-up with your pharmacy.

## 2023-06-24 ENCOUNTER — Telehealth: Payer: Self-pay | Admitting: Family Medicine

## 2023-06-24 NOTE — Telephone Encounter (Unsigned)
 Copied from CRM [id number]. Topic: General - Call Back - No Documentation >> Jun 24, 2023 12:06 PM Florestine Avers wrote: Reason for CRM: Patient called in requesting to speak to Dr Carver Fila nurse by end of day today. Patient did not disclose why she needed his nurse to contact her.

## 2023-06-24 NOTE — Telephone Encounter (Signed)
 Called pt left detailed message to call the office back regarding this message

## 2023-06-25 MED ORDER — ESZOPICLONE 3 MG PO TABS: 3.0000 mg | ORAL_TABLET | Freq: Every day | ORAL | 1 refills | Status: DC

## 2023-06-25 NOTE — Telephone Encounter (Signed)
Pt is returning Brooke Burke call 

## 2023-06-25 NOTE — Telephone Encounter (Signed)
 Attempted to call pt back left a message to call me back

## 2023-07-09 DIAGNOSIS — M170 Bilateral primary osteoarthritis of knee: Secondary | ICD-10-CM | POA: Diagnosis not present

## 2023-07-10 NOTE — Telephone Encounter (Signed)
Not able to speak with pt

## 2023-07-16 DIAGNOSIS — M170 Bilateral primary osteoarthritis of knee: Secondary | ICD-10-CM | POA: Diagnosis not present

## 2023-07-23 DIAGNOSIS — M170 Bilateral primary osteoarthritis of knee: Secondary | ICD-10-CM | POA: Diagnosis not present

## 2023-08-12 ENCOUNTER — Other Ambulatory Visit: Payer: Self-pay | Admitting: Family Medicine

## 2023-08-17 NOTE — Telephone Encounter (Signed)
 Attempted to reach pt to follow up on medication. Left a voice mail to call us  back.

## 2023-09-17 DIAGNOSIS — M25561 Pain in right knee: Secondary | ICD-10-CM | POA: Diagnosis not present

## 2023-09-17 DIAGNOSIS — M25562 Pain in left knee: Secondary | ICD-10-CM | POA: Diagnosis not present

## 2023-09-29 DIAGNOSIS — L812 Freckles: Secondary | ICD-10-CM | POA: Diagnosis not present

## 2023-09-29 DIAGNOSIS — D225 Melanocytic nevi of trunk: Secondary | ICD-10-CM | POA: Diagnosis not present

## 2023-09-29 DIAGNOSIS — D2272 Melanocytic nevi of left lower limb, including hip: Secondary | ICD-10-CM | POA: Diagnosis not present

## 2023-09-29 DIAGNOSIS — D2371 Other benign neoplasm of skin of right lower limb, including hip: Secondary | ICD-10-CM | POA: Diagnosis not present

## 2023-09-29 DIAGNOSIS — L821 Other seborrheic keratosis: Secondary | ICD-10-CM | POA: Diagnosis not present

## 2023-09-29 DIAGNOSIS — Z85828 Personal history of other malignant neoplasm of skin: Secondary | ICD-10-CM | POA: Diagnosis not present

## 2023-09-29 DIAGNOSIS — L723 Sebaceous cyst: Secondary | ICD-10-CM | POA: Diagnosis not present

## 2023-09-29 DIAGNOSIS — D224 Melanocytic nevi of scalp and neck: Secondary | ICD-10-CM | POA: Diagnosis not present

## 2023-09-29 DIAGNOSIS — D692 Other nonthrombocytopenic purpura: Secondary | ICD-10-CM | POA: Diagnosis not present

## 2023-11-19 DIAGNOSIS — M170 Bilateral primary osteoarthritis of knee: Secondary | ICD-10-CM | POA: Diagnosis not present

## 2023-12-04 ENCOUNTER — Ambulatory Visit (INDEPENDENT_AMBULATORY_CARE_PROVIDER_SITE_OTHER)

## 2023-12-04 VITALS — BP 122/62 | HR 73 | Temp 98.5°F | Ht 62.5 in | Wt 123.7 lb

## 2023-12-04 DIAGNOSIS — Z0000 Encounter for general adult medical examination without abnormal findings: Secondary | ICD-10-CM

## 2023-12-04 DIAGNOSIS — Z1231 Encounter for screening mammogram for malignant neoplasm of breast: Secondary | ICD-10-CM

## 2023-12-04 NOTE — Patient Instructions (Signed)
 Brooke Burke , Thank you for taking time out of your busy schedule to complete your Annual Wellness Visit with me. I enjoyed our conversation and look forward to speaking with you again next year. I, as well as your care team,  appreciate your ongoing commitment to your health goals. Please review the following plan we discussed and let me know if I can assist you in the future. Your Game plan/ To Do List    Referrals: If you haven't heard from the office you've been referred to, please reach out to them at the phone provided.   Follow up Visits: We will see or speak with you next year for your Next Medicare AWV with our clinical staff 12/09/24 @ 10a Have you seen your provider in the last 6 months (3 months if uncontrolled diabetes)?   Clinician Recommendations:  Aim for 30 minutes of exercise or brisk walking, 6-8 glasses of water, and 5 servings of fruits and vegetables each day.       This is a list of the screenings recommended for you:  Health Maintenance  Topic Date Due   Mammogram  10/11/2016   Zoster (Shingles) Vaccine (2 of 2) 04/06/2018   COVID-19 Vaccine (6 - 2024-25 season) 07/28/2023   Flu Shot  11/27/2023   Colon Cancer Screening  09/04/2024   Medicare Annual Wellness Visit  12/03/2024   DTaP/Tdap/Td vaccine (4 - Td or Tdap) 09/05/2030   Pneumococcal Vaccine for age over 51  Completed   DEXA scan (bone density measurement)  Completed   Hepatitis C Screening  Completed   Hepatitis B Vaccine  Aged Out   HPV Vaccine  Aged Out   Meningitis B Vaccine  Aged Out    Advanced directives: (Declined) Advance directive discussed with you today. Even though you declined this today, please call our office should you change your mind, and we can give you the proper paperwork for you to fill out. Advance Care Planning is important because it:  [x]  Makes sure you receive the medical care that is consistent with your values, goals, and preferences  [x]  It provides guidance to your  family and loved ones and reduces their decisional burden about whether or not they are making the right decisions based on your wishes.  Follow the link provided in your after visit summary or read over the paperwork we have mailed to you to help you started getting your Advance Directives in place. If you need assistance in completing these, please reach out to us  so that we can help you!  See attachments for Preventive Care and Fall Prevention Tips.

## 2023-12-04 NOTE — Progress Notes (Signed)
 Subjective:   Brooke Burke is a 73 y.o. who presents for a Medicare Wellness preventive visit.  As a reminder, Annual Wellness Visits don't include a physical exam, and some assessments may be limited, especially if this visit is performed virtually. We may recommend an in-person follow-up visit with your provider if needed.  Visit Complete: In person    Persons Participating in Visit: Patient.  AWV Questionnaire: No: Patient Medicare AWV questionnaire was not completed prior to this visit.  Cardiac Risk Factors include: advanced age (>93men, >68 women)     Objective:    Today's Vitals   12/04/23 1003  BP: 122/62  Pulse: 73  Temp: 98.5 F (36.9 C)  TempSrc: Oral  SpO2: 98%  Weight: 123 lb 11.2 oz (56.1 kg)  Height: 5' 2.5 (1.588 m)   Body mass index is 22.26 kg/m.     12/04/2023   10:19 AM 08/09/2021    4:00 PM 08/08/2020    2:25 PM  Advanced Directives  Does Patient Have a Medical Advance Directive? No Yes Yes  Type of Special educational needs teacher of [place];Living will Living will;Healthcare Power of Attorney  Does patient want to make changes to medical advance directive?  No - Patient declined No - Patient declined  Copy of Healthcare Power of Attorney in Chart?  No - copy requested No - copy requested  Would patient like information on creating a medical advance directive? No - Patient declined      Current Medications (verified) Outpatient Encounter Medications as of 12/04/2023  Medication Sig   diphenoxylate -atropine  (LOMOTIL ) 2.5-0.025 MG tablet Take 1 tablet by mouth 2 (two) times daily as needed for diarrhea or loose stools.   eszopiclone  3 MG TABS Take 1 tablet (3 mg total) by mouth at bedtime. Take immediately before bedtime   meloxicam (MOBIC) 7.5 MG tablet Take 7.5 mg by mouth daily.   omeprazole  (PRILOSEC) 20 MG capsule Take 1 capsule (20 mg total) by mouth once for 1 dose. (Patient taking differently: Take 20 mg by mouth once. OTC)    sertraline  (ZOLOFT ) 25 MG tablet TAKE 1 TABLET (25 MG TOTAL) BY MOUTH DAILY. (Patient not taking: Reported on 12/04/2023)   No facility-administered encounter medications on file as of 12/04/2023.    Allergies (verified) Patient has no known allergies.   History: Past Medical History:  Diagnosis Date   Arthritis    Asthma    GERD (gastroesophageal reflux disease)    Headache(784.0)    Insomnia    Past Surgical History:  Procedure Laterality Date   COLONOSCOPY  09/05/2014   per Dr. Luis, precancerous polyp, repeat in 5 yrs    KNEE ARTHROSCOPY Bilateral    TONSILLECTOMY     Family History  Problem Relation Age of Onset   Diabetes Other    Hyperlipidemia Other    Hypertension Other    Social History   Socioeconomic History   Marital status: Single    Spouse name: Not on file   Number of children: Not on file   Years of education: Not on file   Highest education level: Not on file  Occupational History   Not on file  Tobacco Use   Smoking status: Never   Smokeless tobacco: Never  Substance and Sexual Activity   Alcohol use: Yes    Alcohol/week: 0.0 standard drinks of alcohol    Comment: occ   Drug use: No   Sexual activity: Not on file  Other Topics Concern  Not on file  Social History Narrative   Not on file   Social Drivers of Health   Financial Resource Strain: Low Risk  (12/04/2023)   Overall Financial Resource Strain (CARDIA)    Difficulty of Paying Living Expenses: Not hard at all  Food Insecurity: No Food Insecurity (12/04/2023)   Hunger Vital Sign    Worried About Running Out of Food in the Last Year: Never true    Ran Out of Food in the Last Year: Never true  Transportation Needs: No Transportation Needs (12/04/2023)   PRAPARE - Administrator, Civil Service (Medical): No    Lack of Transportation (Non-Medical): No  Physical Activity: Inactive (12/04/2023)   Exercise Vital Sign    Days of Exercise per Week: 0 days    Minutes of Exercise per  Session: 0 min  Stress: No Stress Concern Present (12/04/2023)   [employer] of Occupational Health - Occupational Stress Questionnaire    Feeling of Stress: Not at all  Social Connections: Moderately Integrated (12/04/2023)   Social Connection and Isolation Panel    Frequency of Communication with Friends and Family: More than three times a week    Frequency of Social Gatherings with Friends and Family: More than three times a week    Attends Religious Services: More than 4 times per year    Active Member of [employer] or Organizations: Yes    Attends Engineer, structural: More than 4 times per year    Marital Status: Divorced    Tobacco Counseling Counseling given: Not Answered    Clinical Intake:  Pre-visit preparation completed: Yes  Pain : No/denies pain     BMI - recorded: 22.26 Nutritional Status: BMI of 19-24  Normal Nutritional Risks: None Diabetes: No  Lab Results  Component Value Date   HGBA1C 5.0 12/15/2022   HGBA1C 5.2 01/02/2022   HGBA1C 5.4 09/04/2020     How often do you need to have someone help you when you read instructions, pamphlets, or other written materials from your doctor or pharmacy?: 1 - Never  Interpreter Needed?: No  Information entered by :: Rojelio Blush LPN   Activities of Daily Living     12/04/2023   10:18 AM  In your present state of health, do you have any difficulty performing the following activities:  Hearing? 0  Vision? 0  Difficulty concentrating or making decisions? 0  Walking or climbing stairs? 0  Dressing or bathing? 0  Doing errands, shopping? 0  Preparing Food and eating ? N  Using the Toilet? N  In the past six months, have you accidently leaked urine? N  Do you have problems with loss of bowel control? N  Managing your Medications? N  Managing your Finances? N  Housekeeping or managing your Housekeeping? N    Patient Care Team: Johnny Garnette LABOR, MD as PCP - General  I have updated your Care Teams  any recent Medical Services you may have received from other providers in the past year.     Assessment:   This is a routine wellness examination for Brooke Burke.  Hearing/Vision screen Hearing Screening - Comments:: Denies hearing difficulties   Vision Screening - Comments:: Wears rx glasses - up to date with routine eye exams with  Dr Glendia   Goals Addressed               This Visit's Progress     Increase physical activity (pt-stated)  Depression Screen     12/04/2023   10:03 AM 02/18/2023    3:14 PM 01/19/2023    3:07 PM 12/15/2022    4:22 PM 12/31/2021    4:40 PM 08/09/2021    3:54 PM 09/04/2020    8:18 AM  PHQ 2/9 Scores  PHQ - 2 Score 0 0 2 2 0 0 0  PHQ- 9 Score  2 4 4 1  2     Fall Risk     12/04/2023   10:19 AM 02/18/2023    3:14 PM 01/19/2023    3:06 PM 12/15/2022    4:21 PM 08/19/2022    5:32 PM  Fall Risk   Falls in the past year? 0 0 0 0 0  Number falls in past yr: 0 0 0 0 0  Injury with Fall? 0 0 0 0 0  Risk for fall due to : No Fall Risks No Fall Risks No Fall Risks No Fall Risks No Fall Risks  Follow up Falls evaluation completed Falls evaluation completed Falls evaluation completed Falls evaluation completed Falls prevention discussed    MEDICARE RISK AT HOME:  Medicare Risk at Home Any stairs in or around the home?: No If so, are there any without handrails?: No Home free of loose throw rugs in walkways, pet beds, electrical cords, etc?: Yes Adequate lighting in your home to reduce risk of falls?: Yes Life alert?: No Use of a cane, walker or w/c?: No Grab bars in the bathroom?: No Shower chair or bench in shower?: No Elevated toilet seat or a handicapped toilet?: No  TIMED UP AND GO:  Was the test performed?  Yes  Length of time to ambulate 10 feet: 10 sec Gait steady and fast without use of assistive device  Cognitive Function: 6CIT completed        12/04/2023   10:19 AM 08/09/2021    4:00 PM  6CIT Screen  What Year? 0 points 0 points   What month? 0 points 0 points  What time? 0 points 0 points  Count back from 20 0 points 0 points  Months in reverse 0 points 0 points  Repeat phrase 0 points 0 points  Total Score 0 points 0 points    Immunizations Immunization History  Administered Date(s) Administered   Fluad Quad(high Dose 65+) 01/27/2023   Influenza, High Dose Seasonal PF 02/20/2015, 02/12/2016, 02/10/2017   Influenza,inj,quad, With Preservative 03/09/2014   Influenza-Unspecified 02/12/2016, 02/10/2017, 02/09/2018   Moderna Covid-19 Fall Seasonal Vaccine 78yrs & older 01/16/2022   PFIZER(Purple Top)SARS-COV-2 Vaccination 06/04/2019, 06/25/2019, 02/25/2020   Pfizer(Comirnaty)Fall Seasonal Vaccine 12 years and older 01/27/2023   Pneumococcal Conjugate-13 02/20/2015   Pneumococcal Polysaccharide-23 02/12/2016   Td 12/24/2005   Tdap 09/03/2010, 09/04/2020   Zoster Recombinant(Shingrix) 02/09/2018   Zoster, Live 02/25/2012    Screening Tests Health Maintenance  Topic Date Due   MAMMOGRAM  10/11/2016   Zoster Vaccines- Shingrix (2 of 2) 04/06/2018   COVID-19 Vaccine (6 - 2024-25 season) 07/28/2023   INFLUENZA VACCINE  11/27/2023   Colonoscopy  09/04/2024   Medicare Annual Wellness (AWV)  12/03/2024   DTaP/Tdap/Td (4 - Td or Tdap) 09/05/2030   Pneumococcal Vaccine: 50+ Years  Completed   DEXA SCAN  Completed   Hepatitis C Screening  Completed   Hepatitis B Vaccines  Aged Out   HPV VACCINES  Aged Out   Meningococcal B Vaccine  Aged Out    Health Maintenance  Health Maintenance Due  Topic Date Due  MAMMOGRAM  10/11/2016   Zoster Vaccines- Shingrix (2 of 2) 04/06/2018   COVID-19 Vaccine (6 - 2024-25 season) 07/28/2023   INFLUENZA VACCINE  11/27/2023   Health Maintenance Items Addressed: Mammogram ordered  Additional Screening:  Vision Screening: Recommended annual ophthalmology exams for early detection of glaucoma and other disorders of the eye. Would you like a referral to an eye doctor? No     Dental Screening: Recommended annual dental exams for proper oral hygiene  Community Resource Referral / Chronic Care Management: CRR required this visit?  No   CCM required this visit?  No   Plan:    I have personally reviewed and noted the following in the patient's chart:   Medical and social history Use of alcohol, tobacco or illicit drugs  Current medications and supplements including opioid prescriptions. Patient is not currently taking opioid prescriptions. Functional ability and status Nutritional status Physical activity Advanced directives List of other physicians Hospitalizations, surgeries, and ER visits in previous 12 months Vitals Screenings to include cognitive, depression, and falls Referrals and appointments  In addition, I have reviewed and discussed with patient certain preventive protocols, quality metrics, and best practice recommendations. A written personalized care plan for preventive services as well as general preventive health recommendations were provided to patient.   Rojelio LELON Blush, LPN   04/28/7972   After Visit Summary: (In Person-Printed) AVS printed and given to the patient  Notes: Nothing significant to report at this time.

## 2023-12-08 ENCOUNTER — Ambulatory Visit (INDEPENDENT_AMBULATORY_CARE_PROVIDER_SITE_OTHER): Admitting: Family Medicine

## 2023-12-08 ENCOUNTER — Encounter: Payer: Self-pay | Admitting: Family Medicine

## 2023-12-08 VITALS — BP 124/66 | HR 66 | Temp 98.5°F | Wt 123.0 lb

## 2023-12-08 DIAGNOSIS — G4762 Sleep related leg cramps: Secondary | ICD-10-CM

## 2023-12-08 MED ORDER — ESZOPICLONE 3 MG PO TABS: 3.0000 mg | ORAL_TABLET | Freq: Every day | ORAL | 1 refills | Status: AC

## 2023-12-08 NOTE — Progress Notes (Signed)
   Subjective:    Patient ID: Brooke Burke, female    DOB: 1950-09-09, 73 y.o.   MRN: [id number]  HPI Here for 2 months of painful cramps in both calves that occur only at night. She has tried drinking lots of water and massages. No swelling.    Review of Systems  Constitutional: Negative.   Respiratory: Negative.    Cardiovascular: Negative.   Musculoskeletal:  Positive for myalgias.       Objective:   Physical Exam Constitutional:      Appearance: Normal appearance.  Cardiovascular:     Rate and Rhythm: Normal rate and regular rhythm.     Pulses: Normal pulses.     Heart sounds: Normal heart sounds.  Pulmonary:     Effort: Pulmonary effort is normal.     Breath sounds: Normal breath sounds.  Musculoskeletal:     Right lower leg: No edema.     Left lower leg: No edema.     Comments: Both calves have no tenderness or masses   Neurological:     Mental Status: She is alert.           Assessment & Plan:  Nocturnal leg cramps. We will get some labs today. She will take magnesium OTC 400-800 mg every night before bed.  Garnette Olmsted, MD

## 2023-12-09 LAB — BASIC METABOLIC PANEL WITH GFR
BUN: 27 mg/dL — ABNORMAL HIGH (ref 6–23)
CO2: 26 meq/L (ref 19–32)
Calcium: 9.2 mg/dL (ref 8.4–10.5)
Chloride: 104 meq/L (ref 96–112)
Creatinine, Ser: 1.22 mg/dL — ABNORMAL HIGH (ref 0.40–1.20)
GFR: 43.73 mL/min — ABNORMAL LOW (ref >60.00)
Glucose, Bld: 65 mg/dL — ABNORMAL LOW (ref 70–99)
Potassium: 5.1 meq/L (ref 3.5–5.1)
Sodium: 138 meq/L (ref 135–145)

## 2023-12-09 LAB — CBC WITH DIFFERENTIAL/PLATELET
Basophils Absolute: 0.1 K/uL (ref 0.0–0.1)
Basophils Relative: 0.7 % (ref 0.0–3.0)
Eosinophils Absolute: 0.1 K/uL (ref 0.0–0.7)
Eosinophils Relative: 0.7 % (ref 0.0–5.0)
HCT: 40.8 % (ref 36.0–46.0)
Hemoglobin: 13.4 g/dL (ref 12.0–15.0)
Lymphocytes Relative: 19 % (ref 12.0–46.0)
Lymphs Abs: 2 K/uL (ref 0.7–4.0)
MCHC: 32.9 g/dL (ref 30.0–36.0)
MCV: 95.3 fl (ref 78.0–100.0)
Monocytes Absolute: 0.5 K/uL (ref 0.1–1.0)
Monocytes Relative: 5.2 % (ref 3.0–12.0)
Neutro Abs: 7.7 K/uL (ref 1.4–7.7)
Neutrophils Relative %: 74.4 % (ref 43.0–77.0)
Platelets: 274 K/uL (ref 150.0–400.0)
RBC: 4.28 Mil/uL (ref 3.87–5.11)
RDW: 13.8 % (ref 11.5–15.5)
WBC: 10.3 K/uL (ref 4.0–10.5)

## 2023-12-09 LAB — IBC + FERRITIN
Ferritin: 125.4 ng/mL (ref 10.0–291.0)
Iron: 95 ug/dL (ref 42–145)
Saturation Ratios: 31.1 % (ref 20.0–50.0)
TIBC: 305.2 ug/dL (ref 250.0–450.0)
Transferrin: 218 mg/dL (ref 212.0–360.0)

## 2023-12-09 LAB — TSH: TSH: 1.14 u[IU]/mL (ref 0.35–5.50)

## 2023-12-10 LAB — MAGNESIUM: Magnesium: 2.4 mg/dL (ref 1.5–2.5)

## 2023-12-11 ENCOUNTER — Ambulatory Visit: Payer: Self-pay | Admitting: Family Medicine

## 2024-01-25 DIAGNOSIS — H52223 Regular astigmatism, bilateral: Secondary | ICD-10-CM | POA: Diagnosis not present

## 2024-01-26 DIAGNOSIS — M170 Bilateral primary osteoarthritis of knee: Secondary | ICD-10-CM | POA: Diagnosis not present

## 2024-02-02 DIAGNOSIS — M170 Bilateral primary osteoarthritis of knee: Secondary | ICD-10-CM | POA: Diagnosis not present

## 2024-02-09 DIAGNOSIS — M170 Bilateral primary osteoarthritis of knee: Secondary | ICD-10-CM | POA: Diagnosis not present

## 2024-03-10 DIAGNOSIS — M1711 Unilateral primary osteoarthritis, right knee: Secondary | ICD-10-CM | POA: Diagnosis not present

## 2024-03-10 DIAGNOSIS — M25561 Pain in right knee: Secondary | ICD-10-CM | POA: Diagnosis not present

## 2024-03-10 DIAGNOSIS — M25562 Pain in left knee: Secondary | ICD-10-CM | POA: Diagnosis not present

## 2024-03-10 DIAGNOSIS — M1712 Unilateral primary osteoarthritis, left knee: Secondary | ICD-10-CM | POA: Diagnosis not present

## 2024-03-10 DIAGNOSIS — M170 Bilateral primary osteoarthritis of knee: Secondary | ICD-10-CM | POA: Diagnosis not present

## 2024-03-10 DIAGNOSIS — G8929 Other chronic pain: Secondary | ICD-10-CM | POA: Diagnosis not present

## 2024-04-06 ENCOUNTER — Encounter: Payer: Self-pay | Admitting: Family Medicine

## 2024-05-03 ENCOUNTER — Ambulatory Visit
Admission: RE | Admit: 2024-05-03 | Discharge: 2024-05-03 | Disposition: A | Discharge status: Home / Self Care | Source: Ambulatory Visit | Attending: Family Medicine | Admitting: Family Medicine

## 2024-05-03 DIAGNOSIS — Z1231 Encounter for screening mammogram for malignant neoplasm of breast: Secondary | ICD-10-CM

## 2024-12-09 ENCOUNTER — Ambulatory Visit
